# Patient Record
Sex: Female | Born: 1980 | Race: White | Hispanic: No | Marital: Married | Smoking: Never smoker
Health system: Southern US, Community
[De-identification: ages and names within clinical notes are randomized; demographics above are authoritative.]

## PROBLEM LIST (undated history)

## (undated) DIAGNOSIS — Z789 Other specified health status: Secondary | ICD-10-CM

## (undated) DIAGNOSIS — N6019 Diffuse cystic mastopathy of unspecified breast: Secondary | ICD-10-CM

## (undated) HISTORY — DX: Diffuse cystic mastopathy of unspecified breast: N60.19

## (undated) HISTORY — PX: NO PAST SURGERIES: SHX2092

---

## 2004-03-18 ENCOUNTER — Ambulatory Visit: Payer: Self-pay | Admitting: Family Medicine

## 2004-07-15 ENCOUNTER — Ambulatory Visit: Payer: Self-pay | Admitting: Family Medicine

## 2004-07-28 ENCOUNTER — Ambulatory Visit: Payer: Self-pay | Admitting: Family Medicine

## 2005-06-30 ENCOUNTER — Ambulatory Visit: Payer: Self-pay | Admitting: Family Medicine

## 2005-09-14 ENCOUNTER — Other Ambulatory Visit: Admission: RE | Admit: 2005-09-14 | Discharge: 2005-09-14 | Payer: Self-pay | Admitting: Family Medicine

## 2005-09-14 ENCOUNTER — Ambulatory Visit: Payer: Self-pay | Admitting: Family Medicine

## 2005-09-14 LAB — CONVERTED CEMR LAB: Pap Smear: NORMAL

## 2005-09-18 ENCOUNTER — Ambulatory Visit: Payer: Self-pay | Admitting: Family Medicine

## 2006-07-04 IMAGING — US ULTRASOUND LEFT BREAST
1 series · 4 of 4 positions shown · non-contrast
Comparison: none

REASON FOR EXAM: LEFT breast lump, fibrocystic breasts, family hx breast
cancer  Mammo at [DATE]
COMMENTS:

[Series 1: ultrasound left breast · 4 of 4 slices shown]
[im 1/4]
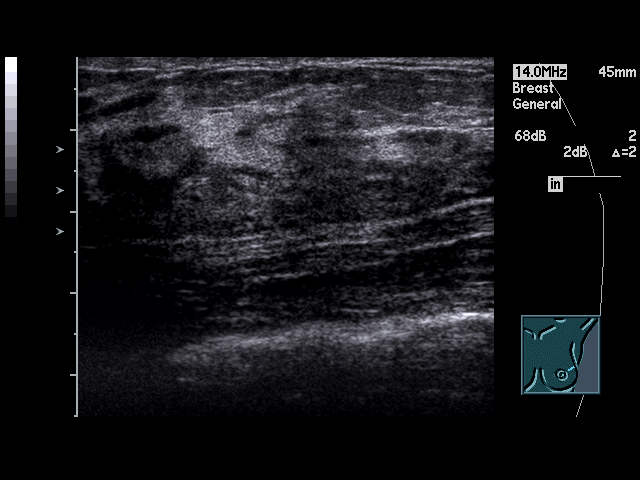
[im 2/4]
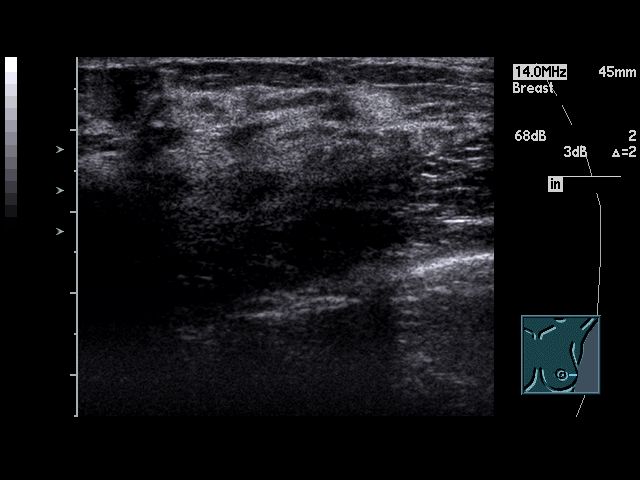
[im 3/4]
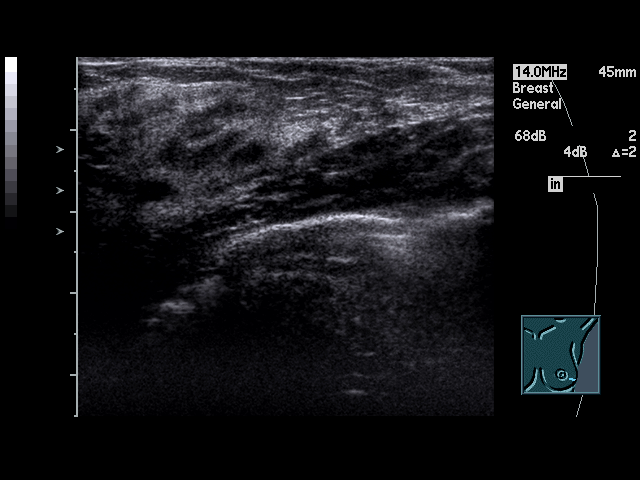
[im 4/4]
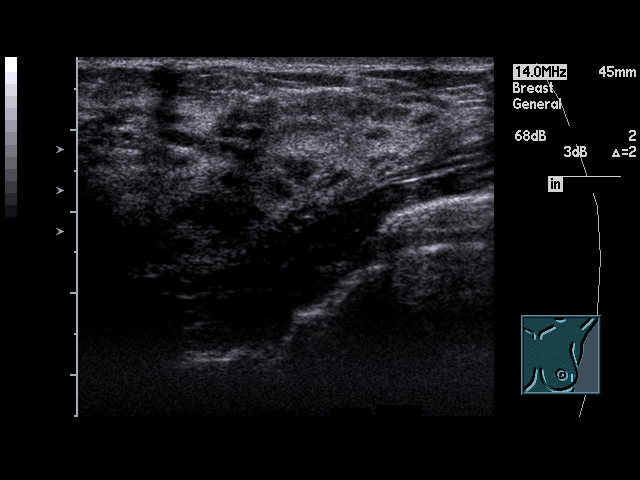

[4 of 4 positions shown; findings below may reference images not displayed]

PROCEDURE:     US  - US BREAST LEFT  - September 18, 2005  [DATE]

RESULT:     The LEFT breast ultrasound was performed in the region of
palpable abnormality.  No cystic or solid abnormalities are identified.  I
performed physical examination in the region of the LEFT breast questionable
abnormality and felt no discrete mass lesion but normal dense parenchyma.
On exam of the contralateral breast, the patient had a similar ridge of
tissue.  Ultrasound was also performed at this region and had the same
appearance.  Given the absence of palpable abnormality by my exam and normal
ultrasound as well as the patient's young age, we discussed that the patient
should just remain in clinical followup and not obtain a mammogram at this
time.  The patient also stated that she has felt this fullness for eight
years and there has been no new abnormality palpable.
IMPRESSION: Normal LEFT breast ultrasound.  Continued close clinical followup exam is
recommended to demonstrate stability.  The patient was also instructed to
perform self-breast exams on a routine basis.  The patient was also notified
as was the patient's physician office that we can perform a mammogram at any
time if a true palpable abnormality is found or if there is further clinical
concern.

## 2007-01-28 ENCOUNTER — Encounter (INDEPENDENT_AMBULATORY_CARE_PROVIDER_SITE_OTHER): Payer: Self-pay | Admitting: *Deleted

## 2007-02-22 ENCOUNTER — Telehealth (INDEPENDENT_AMBULATORY_CARE_PROVIDER_SITE_OTHER): Payer: Self-pay | Admitting: *Deleted

## 2007-05-18 ENCOUNTER — Encounter: Payer: Self-pay | Admitting: Family Medicine

## 2007-05-18 DIAGNOSIS — N6019 Diffuse cystic mastopathy of unspecified breast: Secondary | ICD-10-CM | POA: Insufficient documentation

## 2007-05-18 DIAGNOSIS — L408 Other psoriasis: Secondary | ICD-10-CM | POA: Insufficient documentation

## 2007-07-27 ENCOUNTER — Encounter: Payer: Self-pay | Admitting: Family Medicine

## 2007-07-27 ENCOUNTER — Other Ambulatory Visit: Admission: RE | Admit: 2007-07-27 | Discharge: 2007-07-27 | Payer: Self-pay | Admitting: Family Medicine

## 2007-07-27 ENCOUNTER — Ambulatory Visit: Payer: Self-pay | Admitting: Family Medicine

## 2007-07-28 ENCOUNTER — Telehealth: Payer: Self-pay | Admitting: Family Medicine

## 2007-07-28 LAB — CONVERTED CEMR LAB
Albumin: 3.8 g/dL (ref 3.5–5.2)
BUN: 6 mg/dL (ref 6–23)
Bilirubin, Direct: 0.1 mg/dL (ref 0.0–0.3)
Calcium: 9.2 mg/dL (ref 8.4–10.5)
Cholesterol: 150 mg/dL (ref 0–200)
Eosinophils Absolute: 0.2 10*3/uL (ref 0.0–0.7)
GFR calc Af Amer: 129 mL/min
Glucose, Bld: 86 mg/dL (ref 70–99)
HCT: 36.5 % (ref 36.0–46.0)
Hemoglobin: 12.3 g/dL (ref 12.0–15.0)
MCV: 93.8 fL (ref 78.0–100.0)
Monocytes Absolute: 0.4 10*3/uL (ref 0.1–1.0)
Monocytes Relative: 5 % (ref 3.0–12.0)
Neutro Abs: 5.9 10*3/uL (ref 1.4–7.7)
RDW: 11.8 % (ref 11.5–14.6)
Sodium: 139 meq/L (ref 135–145)
Total CHOL/HDL Ratio: 2.6
Total Protein: 6.7 g/dL (ref 6.0–8.3)
Triglycerides: 77 mg/dL (ref 0–149)

## 2007-08-02 ENCOUNTER — Encounter (INDEPENDENT_AMBULATORY_CARE_PROVIDER_SITE_OTHER): Payer: Self-pay | Admitting: *Deleted

## 2007-08-02 LAB — CONVERTED CEMR LAB: Pap Smear: NORMAL

## 2007-08-08 ENCOUNTER — Encounter (INDEPENDENT_AMBULATORY_CARE_PROVIDER_SITE_OTHER): Payer: Self-pay | Admitting: *Deleted

## 2008-11-09 ENCOUNTER — Other Ambulatory Visit: Admission: RE | Admit: 2008-11-09 | Discharge: 2008-11-09 | Payer: Self-pay | Admitting: Family Medicine

## 2008-11-09 ENCOUNTER — Ambulatory Visit: Payer: Self-pay | Admitting: Family Medicine

## 2008-11-09 ENCOUNTER — Encounter: Payer: Self-pay | Admitting: Family Medicine

## 2008-11-09 LAB — HM PAP SMEAR

## 2008-11-16 ENCOUNTER — Encounter (INDEPENDENT_AMBULATORY_CARE_PROVIDER_SITE_OTHER): Payer: Self-pay | Admitting: *Deleted

## 2009-11-21 ENCOUNTER — Ambulatory Visit: Payer: Self-pay | Admitting: Family Medicine

## 2009-11-21 DIAGNOSIS — M2550 Pain in unspecified joint: Secondary | ICD-10-CM | POA: Insufficient documentation

## 2009-11-22 LAB — CONVERTED CEMR LAB
Eosinophils Relative: 3.5 % (ref 0.0–5.0)
Monocytes Relative: 6.1 % (ref 3.0–12.0)
Neutrophils Relative %: 59.7 % (ref 43.0–77.0)
Platelets: 296 10*3/uL (ref 150.0–400.0)
WBC: 8.1 10*3/uL (ref 4.5–10.5)

## 2010-04-25 ENCOUNTER — Ambulatory Visit
Admission: RE | Admit: 2010-04-25 | Discharge: 2010-04-25 | Payer: Self-pay | Source: Home / Self Care | Attending: Family Medicine | Admitting: Family Medicine

## 2010-05-13 NOTE — Assessment & Plan Note (Signed)
Summary: ? LYMES DISEASE   Vital Signs:  Patient profile:   30 year old female Height:      67 inches Weight:      131.50 pounds BMI:     20.67 Temp:     98.6 degrees F oral Pulse rate:   76 / minute Pulse rhythm:   regular BP sitting:   104 / 70  (left arm) Cuff size:   regular  Vitals Entered By: Lewanda Rife LPN (November 21, 2009 9:05 AM) CC: ?Lymes Disease In May or June had 3 deer ticks, No rash but rt elbow and finger hurts. Sharp pain on and off. Pain level now 0.   History of Present Illness: does not think she has lyme - but family wanted her to get checked out   occ joint pain in her R elbow and index finger -- occ wakes up at night  had 3 deer ticks in may and june one left red spot on stomach for 2 wk (little red spot with no bullseye rash)  no rash at all   has been working in the garden this summer -- and is R handed -- thinks is more of overuse injury  really busy active summer   no fever  is fatigued - due to schedule  no st or n/v  no joint redness and swelling     Allergies: 1)  ! * Td Immunization  Past History:  Past Medical History: Last updated: 07/27/2007 fibrocystic breasts family hx breast ca family hx heart disease  Family History: Last updated: 05/18/2007 Father: MI x 2, CABG x 2, stent, HTN Mother: HTN, breast cancer Siblings: 1 sister  Social History: Last updated: 11/09/2008 nonsmoker alcohol - 2 glasses of wine per week married  works in Scientist, research (physical sciences)  owns small farm with animals   Risk Factors: Smoking Status: never (05/18/2007)  Review of Systems General:  Complains of fatigue; denies chills, fever, loss of appetite, malaise, and weight loss. Eyes:  Denies blurring, discharge, and eye irritation. ENT:  Denies earache, postnasal drainage, sinus pressure, and sore throat. CV:  Denies chest pain or discomfort, palpitations, shortness of breath with exertion, and swelling of feet. Resp:  Denies cough, shortness of  breath, sputum productive, and wheezing. GI:  Denies diarrhea, nausea, and vomiting. MS:  Complains of stiffness; denies joint redness, joint swelling, muscle aches, and cramps. Derm:  Complains of insect bite(s); denies itching, lesion(s), poor wound healing, and rash. Neuro:  Denies headaches, numbness, and tingling. Psych:  mood is ok . Endo:  Denies cold intolerance, excessive thirst, excessive urination, and heat intolerance. Heme:  Denies abnormal bruising and bleeding.  Physical Exam  General:  Well-developed,well-nourished,in no acute distress; alert,appropriate and cooperative throughout examination Head:  normocephalic, atraumatic, and no abnormalities observed.   Eyes:  vision grossly intact, pupils equal, pupils round, and pupils reactive to light.  no conjunctival pallor, injection or icterus  Ears:  R ear normal and L ear normal.   Nose:  no nasal discharge.   Mouth:  pharynx pink and moist.   Neck:  No deformities, masses, or tenderness noted. Lungs:  Normal respiratory effort, chest expands symmetrically. Lungs are clear to auscultation, no crackles or wheezes. Heart:  Normal rate and regular rhythm. S1 and S2 normal without gallop, murmur, click, rub or other extra sounds. Abdomen:  Bowel sounds positive,abdomen soft and non-tender without masses, organomegaly or hernias noted. Msk:  nl rom R elbow with slt lat epicondyle tenderness  R index finger- nl rom and no swelling or tenderness Pulses:  R and L carotid,radial,femoral,dorsalis pedis and posterior tibial pulses are full and equal bilaterally Extremities:  No clubbing, cyanosis, edema, or deformity noted with normal full range of motion of all joints.   Neurologic:  strength normal in all extremities, sensation intact to light touch, gait normal, and DTRs symmetrical and normal.   Skin:  Intact without suspicious lesions or rashes no residual markings from tick biltes in past  Cervical Nodes:  No lymphadenopathy  noted Inguinal Nodes:  No significant adenopathy Psych:  normal affect, talkative and pleasant    Impression & Recommendations:  Problem # 1:  ARTHRALGIA (ICD-719.40) Assessment New in 2 joints without signs of synovitis also fatigue and hx of deer tick bites (no rash ) labs today  suspect joint problems are actually from overuse  Orders: Venipuncture (36644) TLB-CBC Platelet - w/Differential (85025-CBCD) T-Lyme Disease (03474-25956) T- * Misc. Laboratory test [phone])  Problem # 2:  FATIGUE (ICD-780.79) Assessment: New susupect this is actually from long hours at work and in the garden cbc and tick labs today Orders: Venipuncture (43329) TLB-CBC Platelet - w/Differential (85025-CBCD) T-Lyme Disease (51884-16606) T- * Misc. Laboratory test [phone]) Specimen Handling (10932)  Problem # 3:  TICK BITE (ICD-E906.4) Assessment: New gone with no rash or residual findings  see prev assessment Orders: Venipuncture (35573) TLB-CBC Platelet - w/Differential (85025-CBCD) T-Lyme Disease (22025-42706) T- * Misc. Laboratory test [phone]) Specimen Handling (83151)  Patient Instructions: 1)  use ice / cold compresses on finger and elbow - try to rest them  2)  ibuprofen ok as needed otc with food  3)  labs today for tick diseases- will update you  4)  update me if fever/ rash/ worse joint pain  or any other new symptoms   Prior Medications (reviewed today): None Current Allergies (reviewed today): ! * TD IMMUNIZATION

## 2010-05-15 NOTE — Assessment & Plan Note (Signed)
Summary: COLD SYMPTOMS x2 WEEKS AND GETTING WORSE/JRR   Vital Signs:  Patient profile:   30 year old female Weight:      134.50 pounds Temp:     97.9 degrees F oral Pulse rate:   84 / minute Pulse rhythm:   regular BP sitting:   120 / 76  (left arm) Cuff size:   regular  Vitals Entered By: Selena Batten Dance CMA Duncan Dull) (April 25, 2010 8:33 AM) CC: Cold x2 weeks Comments Has taken Nyquil and Mucinex-no help   History of Present Illness: CC: cold x 2 wks.  2wk h/o fever/chills, body aches.  Then started with congestion, chest cold, coughing.  + productive rattly.  hacking cough episodes.  tried mucinex, aspirin, nyquil which seemed to help at night.  Slim chance may be pregnant.  LMP 04/07/2010.  acutally feeling better today.  + lots of congestion and blowing nose at night with clear mucous.  No sinus pressure/head congestion.  No ear pain ,tooth pain, no more fevers/chills, abd pain, n/v/d, rashes, myalgias, arthralgias.    + husband and ppl at work sick last week.    No asthma, alleriges.  did have bad case of asthmatic bronchitis in college.  No smokers at home  Current Medications (verified): 1)  None  Allergies: 1)  ! 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   Eyes:  vision grossly intact, pupils equal, pupils round, and pupils reactive to light.  no conjunctival pallor, injection or icterus  Ears:  R ear normal and L ear normal.   Nose:  no nasal discharge.   Mouth:   pharynx pink and moist.   Neck:  No deformities, masses, or tenderness noted.  no LAD Lungs:  Normal respiratory effort, chest expands symmetrically. Lungs are clear to auscultation, no crackles or wheezes. Heart:  Normal rate and regular rhythm. S1 and S2 normal without gallop, murmur, click, rub or other extra sounds. Pulses:  2+ rad pulses, brisk cap refill   Impression & Recommendations:  Problem # 1:  ACUTE BRONCHITIS (ICD-466.0) zpack to hold on to in case deteriorates over weekend, advised to fill only if not improving as currently.  red flags to return discussed.  given trying for pregnancy, no cough med.  Her updated medication list for this problem includes:    Zithromax Z-pak 250 Mg Tabs (Azithromycin) ..... Use as directed  Complete Medication List: 1)  Zithromax Z-pak 250 Mg Tabs (Azithromycin) .... Use as directed  Patient Instructions: 1)  Sounds like you have a viral upper respiratory infection. 2)  Antibiotics are not needed for this.  Viral infections usually take 7-10 days to resolve.  The cough can last 4 weeks to go away. 3)  guaifenesin (simple mucinex) with plenty of fluid. 4)  cough drops, honey for cough. 5)  A zpack to hold on to in case not improving as expected over weekend. 6)  push fluids, rest over weekend. 7)  Please return if you are not improving as expected, or if you have high fevers (>  101.5) or difficulty swallowing. 8)  Call clinic with questions.  Pleasure to see you today. Prescriptions: ZITHROMAX Z-PAK 250 MG TABS (AZITHROMYCIN) use as directed  #1 x 0   Entered and Authorized by:   Eustaquio Boyden  MD   Signed by:   Eustaquio Boyden  MD on 04/25/2010   Method used:   Print then Give to Patient   RxID:   [id number]    Orders Added: 1)  Est. Patient Level III [14782]    Prior Medications: Current Allergies (reviewed today): ! * TD IMMUNIZATION

## 2010-06-09 ENCOUNTER — Telehealth: Payer: Self-pay | Admitting: Family Medicine

## 2010-06-10 ENCOUNTER — Ambulatory Visit (INDEPENDENT_AMBULATORY_CARE_PROVIDER_SITE_OTHER): Payer: BC Managed Care – PPO | Admitting: Family Medicine

## 2010-06-10 ENCOUNTER — Encounter: Payer: Self-pay | Admitting: Family Medicine

## 2010-06-10 DIAGNOSIS — J069 Acute upper respiratory infection, unspecified: Secondary | ICD-10-CM

## 2010-06-10 LAB — CONVERTED CEMR LAB: Rapid Strep: NEGATIVE

## 2010-06-19 ENCOUNTER — Telehealth: Payer: Self-pay | Admitting: Family Medicine

## 2010-06-19 NOTE — Assessment & Plan Note (Signed)
Summary: EAR PAIN,ST,COUGH,HA,JOINT PAIN/CLE  [employer]   Vital Signs:  Patient profile:   30 year old female Height:      67 inches Weight:      130.50 pounds BMI:     20.51 Temp:     98.2 degrees F oral Pulse rate:   76 / minute Pulse rhythm:   regular Resp:     20 per minute BP sitting:   100 / 68  (left arm) Cuff size:   regular  Vitals Entered By: Lewanda Rife LPN (June 10, 2010 11:49 AM) CC: Left earache, sorethroat,h/a, joint pain primarlily knees, non productive cough.   History of Present Illness: early january had cold and flu symptoms with cough -- saw Dr Reece Agar-- bronchitis dx  got better and then worse - lingered   fri started feeling worse again - filled abx zpack on sat has taken that  sore throat on L side and also ear pain  has had chills and sweats  no fever now -- is getting better  achey too   taken nyquil and ibuprofen- these really help   strep test neg today  L ear pain is the worst    Allergies: 1)  ! 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 Eyes:  Denies blurring and eye irritation. ENT:  Complains of earache, nasal congestion, postnasal drainage, sinus pressure, and sore throat; denies ear discharge. CV:  Denies chest pain or discomfort, palpitations, and shortness of breath with exertion. Resp:  Complains of cough; denies shortness of breath and wheezing. GI:  Denies abdominal pain, change in bowel  habits, nausea, and vomiting. Derm:  Denies itching, lesion(s), poor wound healing, and rash. Neuro:  Denies numbness and tingling.  Physical Exam  General:  Well-developed,well-nourished,in no acute distress; alert,appropriate and cooperative throughout examination Head:  normocephalic, atraumatic, and no abnormalities observed.  no sinus tenderness  Eyes:  vision grossly intact, pupils equal, pupils round, pupils reactive to light, and no injection.   Ears:  R ear normal.  L TM dull with effusion- no erythema or bulging  Nose:  nares are injected and congested bilaterally  Mouth:  pharynx pink and moist, no erythema, and no exudates.   Neck:  No deformities, masses, or tenderness noted. Chest Wall:  No deformities, masses, or tenderness noted. Lungs:  Normal respiratory effort, chest expands symmetrically. Lungs are clear to auscultation, no crackles or wheezes. Heart:  Normal rate and regular rhythm. S1 and S2 normal without gallop, murmur, click, rub or other extra sounds. Msk:  No deformity or scoliosis noted of thoracic or lumbar spine.   Skin:  Intact without suspicious lesions or rashes Cervical Nodes:  No lymphadenopathy noted Psych:  normal affect, talkative and pleasant    Impression & Recommendations:  Problem # 1:  VIRAL URI (ICD-465.9) Assessment New with ear pain / ETD/ nasal congestion and dry cough  fever resolved since the weekend recommend sympt care- see pt instructions   pt advised to  update me if symptoms worsen or do not improve - esp if worse ear pain or fever  did px flonase to help ETD and will keep me updated  Her updated medication list for this problem includes:    Nyquil 60-7.09-09-998 Mg/48ml Liqd (Pseudoeph-doxylamine-dm-apap) ..... Otc as directed.    Ibuprofen 200 Mg Tabs (Ibuprofen) ..... Otc as directed.  Orders: Rapid Strep (04540)  Complete Medication List: 1)  Zithromax Z-pak 250 Mg Tabs (Azithromycin) .... Use as directed 2)  Nyquil  60-7.09-09-998 Mg/73ml Liqd (Pseudoeph-doxylamine-dm-apap) .... Otc as directed. 3)  Ibuprofen 200 Mg Tabs (Ibuprofen) .... Otc as directed. 4)  Flonase 50 Mcg/act Susp (Fluticasone propionate) .... 2 sprays in each nostril once daily  Patient Instructions: 1)  finish the zithromax pack 2)  this stays in your system for a while 3)  continue nyquil and ibuprofen if they help  4)  use flonase nasal spray for at least 2 weeks to help open up sinuses and ears  5)  please call if you think you are getting worse or not starting to improve in the next week Prescriptions: FLONASE 50 MCG/ACT SUSP (FLUTICASONE PROPIONATE) 2 sprays in each nostril once daily  #1 mdi x 1   Entered and Authorized by:   Judith Part MD   Signed by:   Judith Part MD on 06/10/2010   Method used:   Electronically to        [facility]* (retail)       [address]       [place], [place]  [address]       Ph: [phone]       Fax: [phone]   RxID:   [id number]    Orders Added: 1)  Rapid Strep [72536] 2)  Est. Patient Level III [64403]    Current Allergies (reviewed today): ! * TD IMMUNIZATION  Laboratory Results  Date/Time Received: June 10, 2010 11:52 AM  Date/Time Reported: June 10, 2010 11:52 AM   Other Tests  Rapid Strep: negative

## 2010-06-19 NOTE — Progress Notes (Signed)
Summary: pt taking antibiotic  Phone Note Call from Patient   Caller: Patient Call For: Judith Part MD Summary of Call: Pt was seen last month and given z-pack for URI.  She started feeling better so didnt get script filled. Her symptoms started coming back so she got script filled and has been taking abx for 3 days.  She has appt to see you tomorrow and wanted you to know this. Initial call taken by: Lowella Petties CMA, AAMA,  June 09, 2010 11:37 AM  Follow-up for Phone Call        ok -- will see her then - adv her to continue taking the abx since she started it  Follow-up by: Judith Part MD,  June 09, 2010 12:39 PM  Additional Follow-up for Phone Call Additional follow up Details #1::        Left message on machine for patient to call back. Sydell Axon LPN  June 09, 2010 3:37 PM   Patient returned call and was notified.  Additional Follow-up by: Melody Comas,  June 09, 2010 3:41 PM

## 2010-06-24 NOTE — Progress Notes (Signed)
Summary: ears, cough  Phone Note Call from Patient Call back at Home Phone [phone] Call back at Work Phone [phone]   Caller: Patient Call For: Judith Part MD Summary of Call: Patient was seen on 06-10-10 with sinus problems. She says that after finishing her zpak and using the flonase she had started to feel some better, but for the past few days she feels bad all over again, her ears feel stopped up w/ some pain, cough, feels congested, . She is asking if she could get something called in to [facility].  Initial call taken by: Melody Comas,  June 19, 2010 4:44 PM  Follow-up for Phone Call        please notify can try cough syrup called in.  rec continue flonase as that will help with congestion.  use nasal saline irrigation (OTC).   discuss sedation precautions.  Follow-up by: Eustaquio Boyden  MD,  June 19, 2010 4:50 PM  Additional Follow-up for Phone Call Additional follow up Details #1::        Rx called in as directed. Patient notified of instructions. Offered to schedule recheck and she declined stating she will wait and see how she feels tomorrow. Additional Follow-up by: Janee Morn CMA Duncan Dull),  June 19, 2010 5:04 PM    New/Updated Medications: CHERATUSSIN AC 100-10 MG/5ML SYRP (GUAIFENESIN-CODEINE) one teaspoon q6 hours as needed cough, sedation precautions Prescriptions: CHERATUSSIN AC 100-10 MG/5ML SYRP (GUAIFENESIN-CODEINE) one teaspoon q6 hours as needed cough, sedation precautions  #120cc x 0   Entered and Authorized by:   Eustaquio Boyden  MD   Signed by:   Janee Morn CMA (AAMA) on 06/19/2010   Method used:   Telephoned to ...       [facility]* (retail)       [address]       [place], [place]  [address]       Ph: [phone]       Fax: [phone]   RxID:   [id number]

## 2010-11-04 ENCOUNTER — Encounter: Payer: Self-pay | Admitting: Family Medicine

## 2010-11-05 ENCOUNTER — Ambulatory Visit (INDEPENDENT_AMBULATORY_CARE_PROVIDER_SITE_OTHER): Payer: BC Managed Care – PPO | Admitting: Family Medicine

## 2010-11-05 ENCOUNTER — Encounter: Payer: Self-pay | Admitting: Family Medicine

## 2010-11-05 VITALS — BP 124/72 | HR 92 | Temp 97.9°F | Ht 67.0 in | Wt 130.2 lb

## 2010-11-05 DIAGNOSIS — Z3400 Encounter for supervision of normal first pregnancy, unspecified trimester: Secondary | ICD-10-CM | POA: Insufficient documentation

## 2010-11-05 DIAGNOSIS — Z3200 Encounter for pregnancy test, result unknown: Secondary | ICD-10-CM

## 2010-11-05 LAB — POCT URINE PREGNANCY: Preg Test, Ur: POSITIVE

## 2010-11-05 MED ORDER — FOLIC ACID 400 MCG PO TABS: 800.0000 ug | ORAL_TABLET | Freq: Every day | ORAL | Status: DC

## 2010-11-05 NOTE — Assessment & Plan Note (Addendum)
By LMP 6 4/7 weeks  Is tired and mild nausea- but otherwise doing well  Counseled extensively on pregnancy care and options as well as expectations for first trimester  Disc ways to control nausea and need for fluids/ nutrition/sleep >25 min spent with face to face with patient, >50% counseling and/or coordinating care   Ref to [facility] practice with midwife

## 2010-11-05 NOTE — Progress Notes (Signed)
Subjective:    Patient ID: Dominique Russo, female    DOB: Oct 21, 1980, 30 y.o.   MRN: [id number]  HPI LMP June 9th  Usually pretty regular This is her first pregnancy   Did pregnancy test at home was pos  Needs to do one here   Not trying very long  Is taking folic acid 800 mg  and DHA over the counter  Not on a regular multivitamin   Is really nauseated but no vomiting - pretty much all the time  Managed to mt weight  Good balanced diet and is organic  Still has an appetite  No heartburn   Is urinating more frequently but no dysuria   Is not reading any books   Is quite tired and was able to cut hours in work   Wants to give birth out of a hospital - in a birthing center That will probably be in [place]  Would consider hosp birth if she had a midwife  Patient Active Problem List  Diagnoses  . FIBROCYSTIC BREAST DISEASE  . PSORIASIS  . ARTHRALGIA  . Pregnancy, first   Past Medical History  Diagnosis Date  . Fibrocystic breast    No past surgical history on file. History  Substance Use Topics  . Smoking status: Never Smoker   . Smokeless tobacco: Not on file  . Alcohol Use: Yes     2 glasses of wine per week    Family History  Problem Relation Age of Onset  . Hypertension Mother   . Breast cancer Mother   . Heart attack Father     x 2, CBG x 2, stent  . Hypertension Father    Allergies  Allergen Reactions  . Tetanus-Diphtheria Toxoids     REACTION: painful local reaction 4/09   No current outpatient prescriptions on file prior to visit.          Review of Systems Review of Systems  Constitutional: Negative for fever, appetite change, and unexpected weight change. pos for fatigue  Eyes: Negative for pain and visual disturbance.  Respiratory: Negative for cough and shortness of breath.   Cardiovascular: Negative.  for cp or sob or edema  Gastrointestinal: Negative for, diarrhea and constipation. pos for nausea Genitourinary:  Negative for urgency and frequency.  Skin: Negative for pallor. or rash  Neurological: Negative for weakness, light-headedness, numbness and headaches.  Hematological: Negative for adenopathy. Does not bruise/bleed easily.  Psychiatric/Behavioral: Negative for dysphoric mood. The patient is not nervous/anxious.          Objective:   Physical Exam  Constitutional: She appears well-developed and well-nourished. No distress.  HENT:  Head: Normocephalic and atraumatic.  Mouth/Throat: Oropharynx is clear and moist.  Eyes: Conjunctivae and EOM are normal. Pupils are equal, round, and reactive to light.  Neck: Normal range of motion. Neck supple. No JVD present. Carotid bruit is not present. No thyromegaly present.  Cardiovascular: Normal rate, regular rhythm, normal heart sounds and intact distal pulses.   No murmur heard. Pulmonary/Chest: Effort normal and breath sounds normal. No respiratory distress. She has no wheezes.  Abdominal: Soft. Bowel sounds are normal. She exhibits no distension and no mass. There is no tenderness. There is no rebound and no guarding.       No suprapubic tenderness  Or fullness Unable to palp fundus  Musculoskeletal: Normal range of motion. She exhibits no edema and no tenderness.  Lymphadenopathy:    She has no cervical adenopathy.  Neurological: She is  alert. She has normal reflexes.       No tremor  Skin: Skin is warm and dry. No rash noted. No erythema. No pallor.  Psychiatric: She has a normal mood and affect.       Pt seems very mildly anxious           Assessment & Plan:

## 2010-11-05 NOTE — Patient Instructions (Addendum)
Urine pregnancy test today We will do referral for OB at check out  Consider reading "what to expect when you are expecting"  Drink lots of fluids Eat small frequent meals for nausea Get enough rest Continue folic acid

## 2010-11-24 LAB — HIV ANTIBODY (ROUTINE TESTING W REFLEX): HIV: NONREACTIVE

## 2010-11-24 LAB — ABO/RH

## 2010-11-24 LAB — GC/CHLAMYDIA PROBE AMP, GENITAL
Chlamydia: NEGATIVE
Gonorrhea: NEGATIVE

## 2011-01-08 LAB — STREP B DNA PROBE: GBS: POSITIVE

## 2011-04-14 NOTE — L&D Delivery Note (Signed)
Delivery Note  Complete dilation at 2220 Onset of pushing at 0100 consistently and effective FHR second stage baseline 150 - some variables to 100-110  Anesthesia: local for repair only / no analgesia in labor  Delivery of a viable female at 0213 by CNM in ROA position with compound presentation Nuchal Cord none. Cord double clamped after cessation of pulsation, cut by provider.  Cord blood sample collected.    Placenta delivered 0223 intact with 3 VC.  Placenta for disposal. Uterine tone boggy with brisk bleeding  - uterine massage and IV bolus pitocin Intermittent tone despite pitocin and massage -Cytotec per rectum  2nd degree perineal laceration with left sulcus Xylocaine 1% local given 3-0 locked vicryl repair of Left sulcus with hemostasis 3-0 vicryl deep interrupted for muscle approximation in standard fashion 4-0 vicryl for perineal and subcuticular closure Est. Blood Loss (mL): 600  Complications: none  Mom to postpartum.  Baby to nursery-stable.  [name] 07/03/2011, 2:59 AM

## 2011-07-02 ENCOUNTER — Encounter (HOSPITAL_COMMUNITY): Payer: Self-pay | Admitting: *Deleted

## 2011-07-02 ENCOUNTER — Inpatient Hospital Stay (HOSPITAL_COMMUNITY)
Admission: AD | Admit: 2011-07-02 | Discharge: 2011-07-04 | DRG: 373 | Disposition: A | Discharge status: Home / Self Care | Payer: BC Managed Care – PPO | Source: Ambulatory Visit | Attending: Obstetrics | Admitting: Obstetrics

## 2011-07-02 DIAGNOSIS — O328XX0 Maternal care for other malpresentation of fetus, not applicable or unspecified: Secondary | ICD-10-CM | POA: Diagnosis present

## 2011-07-02 DIAGNOSIS — O99892 Other specified diseases and conditions complicating childbirth: Secondary | ICD-10-CM | POA: Diagnosis present

## 2011-07-02 DIAGNOSIS — O480 Post-term pregnancy: Principal | ICD-10-CM | POA: Diagnosis present

## 2011-07-02 DIAGNOSIS — Z22330 Carrier of Group B streptococcus: Secondary | ICD-10-CM

## 2011-07-02 HISTORY — DX: Other specified health status: Z78.9

## 2011-07-02 LAB — CBC
Hemoglobin: 13.1 g/dL (ref 12.0–15.0)
MCH: 33.2 pg (ref 26.0–34.0)
MCV: 95.7 fL (ref 78.0–100.0)
RBC: 3.94 MIL/uL (ref 3.87–5.11)
WBC: 20.2 10*3/uL — ABNORMAL HIGH (ref 4.0–10.5)

## 2011-07-02 MED ORDER — FLEET ENEMA 7-19 GM/118ML RE ENEM: 1.0000 | ENEMA | RECTAL | Status: DC | PRN

## 2011-07-02 MED ORDER — PENICILLIN G POTASSIUM 5000000 UNITS IJ SOLR
2.5000 10*6.[IU] | INTRAVENOUS | Status: DC
  Filled 2011-07-02 (×2): qty 2.5

## 2011-07-02 MED ORDER — CITRIC ACID-SODIUM CITRATE 334-500 MG/5ML PO SOLN: 30.0000 mL | ORAL | Status: DC | PRN

## 2011-07-02 MED ORDER — LACTATED RINGERS IV SOLN: INTRAVENOUS | Status: DC

## 2011-07-02 MED ORDER — IBUPROFEN 600 MG PO TABS
600.0000 mg | ORAL_TABLET | Freq: Four times a day (QID) | ORAL | Status: DC | PRN
  Administered 2011-07-03: 600 mg via ORAL
  Filled 2011-07-02: qty 1

## 2011-07-02 MED ORDER — LACTATED RINGERS IV SOLN
500.0000 mL | INTRAVENOUS | Status: DC | PRN
  Administered 2011-07-03: 300 mL via INTRAVENOUS

## 2011-07-02 MED ORDER — LIDOCAINE HCL (PF) 1 % IJ SOLN
30.0000 mL | INTRAMUSCULAR | Status: DC | PRN
  Administered 2011-07-03: 30 mL via SUBCUTANEOUS
  Filled 2011-07-02: qty 30

## 2011-07-02 MED ORDER — OXYCODONE-ACETAMINOPHEN 5-325 MG PO TABS: 1.0000 | ORAL_TABLET | ORAL | Status: DC | PRN

## 2011-07-02 MED ORDER — CLINDAMYCIN PHOSPHATE 900 MG/50ML IV SOLN
900.0000 mg | Freq: Three times a day (TID) | INTRAVENOUS | Status: DC
  Administered 2011-07-02: 900 mg via INTRAVENOUS
  Filled 2011-07-02 (×3): qty 50

## 2011-07-02 MED ORDER — ONDANSETRON HCL 4 MG/2ML IJ SOLN: 4.0000 mg | Freq: Four times a day (QID) | INTRAMUSCULAR | Status: DC | PRN

## 2011-07-02 MED ORDER — OXYTOCIN BOLUS FROM INFUSION
500.0000 mL | Freq: Once | INTRAVENOUS | Status: DC
  Filled 2011-07-02: qty 500
  Filled 2011-07-02: qty 1000

## 2011-07-02 MED ORDER — PENICILLIN G POTASSIUM 5000000 UNITS IJ SOLR
5.0000 10*6.[IU] | Freq: Once | INTRAVENOUS | Status: DC
  Filled 2011-07-02: qty 5

## 2011-07-02 MED ORDER — OXYTOCIN 20 UNITS IN LACTATED RINGERS INFUSION - SIMPLE
125.0000 mL/h | Freq: Once | INTRAVENOUS | Status: AC
  Administered 2011-07-03: 125 mL/h via INTRAVENOUS

## 2011-07-02 MED ORDER — ACETAMINOPHEN 325 MG PO TABS: 650.0000 mg | ORAL_TABLET | ORAL | Status: DC | PRN

## 2011-07-02 MED ORDER — CLINDAMYCIN PHOSPHATE 300 MG/50ML IV SOLN: 300.0000 mg | Freq: Three times a day (TID) | INTRAVENOUS | Status: DC

## 2011-07-02 NOTE — MAU Note (Signed)
Pt in for labor eval, reports ucs since 1630. Denies any bleeding or lof.  + FM.

## 2011-07-02 NOTE — Progress Notes (Signed)
Patient ID: Dominique Russo, female   DOB: April 12, 1981, 31 y.o.   MRN: [id number]   S: Feeling some pressure - ctx stronger     Tolerating contractions well   O:  VS: Blood pressure 127/85, pulse 82, temperature 99.4 F (37.4 C), temperature source Axillary, resp. rate 20, height 5' 6.5" (1.689 m), weight 77.111 kg (170 lb), last menstrual period 09/20/2010.        FHR : baseline 150 / variability moderate / accels + / decels- early        Toco: contractions every 2-4 minutes / coupling         Cervix : 8-9/ 90% / vtx / +1 / BBOW        Membranes: AROM - clear  A: Active labor     FHR category 1  P: Expectant management     Anticipate SVB     [name] 07/02/2011, 9:39 PM

## 2011-07-02 NOTE — H&P (Signed)
  OB ADMISSION/ HISTORY & PHYSICAL:  Admission Date: 07/02/2011  6:53 PM  Admit Diagnosis: Active labor  Dominique Russo is a 31 y.o. female presenting for onset of labor - ctx irregular since midnight and regular since 2pm today.  Prenatal History: G1P0   EDC : 06/27/2011 Prenatal care at [facility]  Prenatal course complicated by anxiety / GBS bacturia - resistant strain  Prenatal Labs: ABO, Rh: O (08/13 0000) positive Antibody: Negative (08/13 0000) Rubella: Immune (08/13 0000)  RPR: Nonreactive (08/13 0000)  HBsAg: Negative (08/13 0000)  HIV: Non-reactive (08/13 0000)  GBS: Positive (09/27 0000)  1 hr Glucola : NL   Medical / Surgical History :  Past medical history:  Past Medical History  Diagnosis Date  . Fibrocystic breast   . No pertinent past medical history      Past surgical history:  Past Surgical History  Procedure Date  . No past surgeries      Family History:  Family History  Problem Relation Age of Onset  . Hypertension Mother   . Breast cancer Mother   . Heart attack Father     x 2, CBG x 2, stent  . Hypertension Father   . Anesthesia problems Neg Hx      Social History:  reports that she has never smoked. She does not have any smokeless tobacco history on file. She reports that she drinks alcohol. She reports that she does not use illicit drugs.   Allergies: Tetanus-diphtheria toxoids    Current Medications at time of admission:  Prenatal vitamin  Review of Systems:  Physical Exam:  Dilation: 4.5 Effacement (%): 80 Station: -1 Exam by:: K Anderson,RN  VS: 99.4- 82-20-127/85 General: Alert and oriented  / Anxious Heart: RRR Lungs: clear Abdomen: gravid / non-tender Extremities: no edema  FHR: 150 / moderate / + accels / no decels TOCO: every 5 minutes   Assessment: 40 5/7 weeks active labor + GBS (resistant strain - unsuccessful treatment antepartum with PCN /  cephalosporin)  Plan:  Admit Clindamycin prophylaxis for GBS  Expectant management  Dominique Russo 07/02/2011, 8:15 PM

## 2011-07-03 ENCOUNTER — Encounter (HOSPITAL_COMMUNITY): Payer: Self-pay | Admitting: *Deleted

## 2011-07-03 LAB — ABO/RH: ABO/RH(D): O POS

## 2011-07-03 MED ORDER — METHYLERGONOVINE MALEATE 0.2 MG PO TABS
0.2000 mg | ORAL_TABLET | Freq: Four times a day (QID) | ORAL | Status: AC
  Administered 2011-07-03 – 2011-07-04 (×3): 0.2 mg via ORAL
  Filled 2011-07-03 (×4): qty 1

## 2011-07-03 MED ORDER — SIMETHICONE 80 MG PO CHEW: 80.0000 mg | CHEWABLE_TABLET | ORAL | Status: DC | PRN

## 2011-07-03 MED ORDER — DIBUCAINE 1 % RE OINT: 1.0000 "application " | TOPICAL_OINTMENT | RECTAL | Status: DC | PRN

## 2011-07-03 MED ORDER — MISOPROSTOL 200 MCG PO TABS
ORAL_TABLET | ORAL | Status: AC
  Administered 2011-07-03: 800 ug via RECTAL
  Filled 2011-07-03: qty 4

## 2011-07-03 MED ORDER — WITCH HAZEL-GLYCERIN EX PADS: 1.0000 "application " | MEDICATED_PAD | CUTANEOUS | Status: DC | PRN

## 2011-07-03 MED ORDER — PRENATAL MULTIVITAMIN CH
1.0000 | ORAL_TABLET | Freq: Every day | ORAL | Status: DC
  Administered 2011-07-03 – 2011-07-04 (×2): 1 via ORAL
  Filled 2011-07-03 (×3): qty 1

## 2011-07-03 MED ORDER — BENZOCAINE-MENTHOL 20-0.5 % EX AERO
1.0000 "application " | INHALATION_SPRAY | CUTANEOUS | Status: DC | PRN
  Administered 2011-07-03: 1 via TOPICAL

## 2011-07-03 MED ORDER — DIPHENHYDRAMINE HCL 25 MG PO CAPS: 25.0000 mg | ORAL_CAPSULE | Freq: Four times a day (QID) | ORAL | Status: DC | PRN

## 2011-07-03 MED ORDER — IBUPROFEN 600 MG PO TABS
600.0000 mg | ORAL_TABLET | Freq: Four times a day (QID) | ORAL | Status: DC
  Administered 2011-07-03 – 2011-07-04 (×5): 600 mg via ORAL
  Filled 2011-07-03 (×5): qty 1

## 2011-07-03 MED ORDER — LANOLIN HYDROUS EX OINT: TOPICAL_OINTMENT | CUTANEOUS | Status: DC | PRN

## 2011-07-03 MED ORDER — SENNOSIDES-DOCUSATE SODIUM 8.6-50 MG PO TABS
2.0000 | ORAL_TABLET | Freq: Every day | ORAL | Status: DC
  Administered 2011-07-03: 2 via ORAL

## 2011-07-03 MED ORDER — BENZOCAINE-MENTHOL 20-0.5 % EX AERO
INHALATION_SPRAY | CUTANEOUS | Status: AC
  Administered 2011-07-03: 1 via TOPICAL
  Filled 2011-07-03: qty 56

## 2011-07-03 MED ORDER — OXYCODONE-ACETAMINOPHEN 5-325 MG PO TABS
1.0000 | ORAL_TABLET | ORAL | Status: DC | PRN
  Administered 2011-07-03 (×2): 1 via ORAL
  Filled 2011-07-03 (×2): qty 1

## 2011-07-03 NOTE — Progress Notes (Signed)
2220 - Complete dilation with VTX +2 no urge to push / moaning and grunting             Up to bathroom for BM / pushing intermittently / grunting / crying/ screaming in bathroom  2345 back to bed - intermittent grunting / crying / acute anxiety          Provider talking to relax patient / relaxation imaging - maternal position changes           Recheck fetal station vtx at +2 / attempting coached efforts for pushing in different positions without success          Acute anxiety - feels "panicked" - "cant do it"  Offered epidural - declined  Providing continuous labor support / emotional security with spouse at bedside Continued position changes trying to find position to promote consistent / effective pushing  0030- 0100 -finally  effective pushing established in lithotomy with coached pushing - vtx +3 / patient calm and focused    VS: Blood pressure 133/83, pulse 95, temperature 98.3 F (36.8 C), temperature source Axillary, resp. rate 20, height 5' 6.5" (1.689 m), weight 77.111 kg (170 lb), last menstrual period 09/20/2010.   FHR :   doppler FHR : baseline 150s intermittently    EFM intermittently - baseline 150 /  variability moderate / accels +    + variable decels audible with pushing ~ 0115 / EFM reapplied with some variables to nadir 100-110    Toco: contractions every 2-4 minutes / mild - moderate     A: protracted second stage - active labor due to acute anxiety     FHR category 2  P: Active second stage for 1 hour with some fetal descent      Ineffective CTX intensity - patient declines pitocin augmentation      Mainline IV bolus for uterine hydration      Continue pushing x 1 hour - if no further progress - recommend epidural / pitocin prior to C/S if FHR reassuring     Rhylen Shaheen 07/03/2011, 1:41 AM

## 2011-07-03 NOTE — Progress Notes (Signed)
Pt up to wheelchair and transferred to motherbaby

## 2011-07-04 LAB — CBC
HCT: 27.6 % — ABNORMAL LOW (ref 36.0–46.0)
Hemoglobin: 9.4 g/dL — ABNORMAL LOW (ref 12.0–15.0)
MCH: 33 pg (ref 26.0–34.0)
MCHC: 34.1 g/dL (ref 30.0–36.0)
MCV: 96.8 fL (ref 78.0–100.0)
Platelets: 230 10*3/uL (ref 150–400)
RBC: 2.85 MIL/uL — ABNORMAL LOW (ref 3.87–5.11)
RDW: 13.3 % (ref 11.5–15.5)
WBC: 18.2 10*3/uL — ABNORMAL HIGH (ref 4.0–10.5)

## 2011-07-04 MED ORDER — POLYSACCHARIDE IRON COMPLEX 150 MG PO CAPS: 150.0000 mg | ORAL_CAPSULE | Freq: Two times a day (BID) | ORAL | Status: AC

## 2011-07-04 MED ORDER — OXYCODONE-ACETAMINOPHEN 5-325 MG PO TABS: 1.0000 | ORAL_TABLET | ORAL | Status: AC | PRN

## 2011-07-04 MED ORDER — IBUPROFEN 600 MG PO TABS: 600.0000 mg | ORAL_TABLET | Freq: Four times a day (QID) | ORAL | Status: AC

## 2011-07-04 NOTE — Progress Notes (Signed)
Patient ID: Dominique Russo, female   DOB: 1980/05/15, 31 y.o.   MRN: [id number]  PPD 1 SVD  S:  Reports feeling well - wants early discharge             Tolerating po/ No nausea or vomiting             Bleeding is light             Pain controlled with motrin and percocet             Up ad lib / ambulatory  Newborn breast-feeding     O:  A & O x 3 NAD             VS: Blood pressure 120/76, pulse 85, temperature 97.5 F (36.4 C), temperature source Oral, resp. rate 18, height 5\' 7"  (1.702 m), weight 77.111 kg (170 lb), last menstrual period 09/20/2010, SpO2 99.00%, unknown if currently breastfeeding.  LABS: WBC/Hgb/Hct/Plts:  18.2/9.4/27.6/230 (03/23 0727)   Lungs: Clear and unlabored  Heart: regular rate and rhythm / no mumurs  Abdomen: soft, non-tender, non-distended              Fundus: firm, non-tender, U-2  Perineum: mild edema  Lochia: light  Extremities: no edema, no calf pain or tenderness    A: PPD # 1   Doing well - stable status  P:  Routine post partum orders  Stable for early discharge  [name] 07/04/2011, 12:23 PM

## 2011-07-04 NOTE — Progress Notes (Signed)

## 2011-07-04 NOTE — Discharge Summary (Signed)
Obstetric Discharge Summary Reason for Admission: onset of labor Prenatal Procedures: NST and ultrasound for postdates Intrapartum Procedures: spontaneous vaginal delivery and GBS prophylaxis Postpartum Procedures: none Complications-Operative and Postpartum: 2nd degree perineal laceration Hemoglobin  Date Value Range Status  07/04/2011 9.4* 12.0-15.0 (g/dL) Final     DELTA CHECK NOTED     REPEATED TO VERIFY     HCT  Date Value Range Status  07/04/2011 27.6* 36.0-46.0 (%) Final    Physical Exam:  General: alert, cooperative and no distress Lochia: appropriate Uterine Fundus: firm Incision: healing well DVT Evaluation: No evidence of DVT seen on physical exam.  Discharge Diagnoses: Term Pregnancy-delivered and mild ABL anemia  Discharge Information: Date: 07/04/2011 Activity: pelvic rest Diet: routine Medications: PNV, Ibuprofen and Percocet and iron Condition: stable Instructions: refer to practice specific booklet Discharge to: home Follow-up Information    Follow up with [name], CNM. Schedule an appointment as soon as possible for a visit in 6 weeks.   Contact information:   [address] [place] [address] [phone]          Newborn Data: Live born female  Birth Weight: 8 lb 0.6 oz (3645 g) APGAR: 9, 9  Home with mother.  [name] 07/04/2011, 12:26 PM

## 2013-09-05 ENCOUNTER — Telehealth: Payer: Self-pay | Admitting: Family Medicine

## 2013-09-05 NOTE — Telephone Encounter (Signed)
Left voicemail requesting pt to call office 

## 2013-09-05 NOTE — Telephone Encounter (Signed)
Pt states she is pregnant and her new insurance requires a referral. She would like a referral placed for [facility] on [address] in [place] [phone], pt prefers Midwife, [name]. Can you place the referral even though patient hasn't been seen since July, 2013? Thank you.

## 2013-09-05 NOTE — Telephone Encounter (Signed)
Pt just wanted to come in and est. pregnancy to be on the safe side, appt scheduled for 09/08/13

## 2013-09-05 NOTE — Telephone Encounter (Signed)
I am fine with that - if the practice allows a referral without a documented pregnancy test in a doctor's office -have her call them and check on that and then send this message back to me with her LMP Thanks

## 2013-09-08 ENCOUNTER — Ambulatory Visit: Payer: BC Managed Care – PPO | Admitting: Family Medicine

## 2013-09-12 ENCOUNTER — Ambulatory Visit (INDEPENDENT_AMBULATORY_CARE_PROVIDER_SITE_OTHER): Payer: 59 | Admitting: Family Medicine

## 2013-09-12 ENCOUNTER — Encounter: Payer: Self-pay | Admitting: Family Medicine

## 2013-09-12 VITALS — BP 114/62 | HR 90 | Temp 98.4°F | Ht 67.0 in | Wt 135.5 lb

## 2013-09-12 DIAGNOSIS — Z3490 Encounter for supervision of normal pregnancy, unspecified, unspecified trimester: Secondary | ICD-10-CM

## 2013-09-12 DIAGNOSIS — Z331 Pregnant state, incidental: Secondary | ICD-10-CM

## 2013-09-12 DIAGNOSIS — Z3200 Encounter for pregnancy test, result unknown: Secondary | ICD-10-CM

## 2013-09-12 LAB — POCT URINE PREGNANCY: PREG TEST UR: POSITIVE

## 2013-09-12 NOTE — Progress Notes (Signed)
Subjective:    Patient ID: Dominique MastersElizabeth A Russo, female    DOB: 02/15/1981, 33 y.o.   MRN: [id number]  HPI Here for 2nd pregnancy   Home test neg  Here upreg neg  07/08/13 LMP- normal and she does not tend to skip them  EDC would be Jan 3 9 2/7 weeks today by dates   Is feeling pretty rotten in general  Nauseated - worse in evenings , is eating small amt frequently -does not help much No vomiting  Wt is fine with bmi of 21  Very tired   No complications with first pregnancy  nsvd -no problems   She is on PNV   In April she thought she had a cold/ told it was allergies  Took tylenol at the time  Got sick again in May - took tylenol again Worried about that  No alcohol or smoking   Patient Active Problem List   Diagnosis Date Noted  . Pregnancy 09/12/2013  . Postpartum care following vaginal delivery (3/22) 07/04/2011  . Pregnancy, first 11/05/2010  . ARTHRALGIA 11/21/2009  . FIBROCYSTIC BREAST DISEASE 05/18/2007  . PSORIASIS 05/18/2007   Past Medical History  Diagnosis Date  . Fibrocystic breast   . No pertinent past medical history    Past Surgical History  Procedure Laterality Date  . No past surgeries     History  Substance Use Topics  . Smoking status: Never Smoker   . Smokeless tobacco: Not on file  . Alcohol Use: No   Family History  Problem Relation Age of Onset  . Hypertension Mother   . Breast cancer Mother   . Heart attack Father     x 2, CBG x 2, stent  . Hypertension Father   . Anesthesia problems Neg Hx    Allergies  Allergen Reactions  . Tetanus-Diphtheria Toxoids Td     REACTION: painful local reaction 4/09   Current Outpatient Prescriptions on File Prior to Visit  Medication Sig Dispense Refill  . Prenatal Vit-Fe Fumarate-FA (PRENATAL MULTIVITAMIN) TABS Take 1 tablet by mouth daily.       No current facility-administered medications on file prior to visit.    Review of Systems Review of Systems  Constitutional: Negative for  fever, appetite change,  and unexpected weight change.  Eyes: Negative for pain and visual disturbance.  Respiratory: Negative for cough and shortness of breath.   Cardiovascular: Negative for cp or palpitations    Gastrointestinal: Negative for, diarrhea and constipation. pos for nausea w/o vomiting Genitourinary: Negative for urgency and frequency.  Skin: Negative for pallor or rash   Neurological: Negative for weakness, light-headedness, numbness and headaches.  Hematological: Negative for adenopathy. Does not bruise/bleed easily.  Psychiatric/Behavioral: Negative for dysphoric mood. The patient is not nervous/anxious.         Objective:   Physical Exam  Constitutional: She appears well-developed and well-nourished. No distress.  HENT:  Head: Normocephalic and atraumatic.  Eyes: Conjunctivae and EOM are normal. Pupils are equal, round, and reactive to light. No scleral icterus.  Neck: Normal range of motion. Neck supple.  Cardiovascular: Normal rate and regular rhythm.   Pulmonary/Chest: Effort normal and breath sounds normal. No respiratory distress. She has no wheezes. She has no rales.  Abdominal: Soft. Bowel sounds are normal. She exhibits no distension and no mass. There is no tenderness. There is no rebound and no guarding.  No suprapubic fullness or tenderness  Musculoskeletal: She exhibits no edema.  Lymphadenopathy:    She  has no cervical adenopathy.  Neurological: She is alert. She has normal reflexes.  Skin: Skin is warm and dry. No rash noted. No pallor.  Psychiatric: She has a normal mood and affect.          Assessment & Plan:   Problem List Items Addressed This Visit     Other   Pregnancy     2nd pregnancy approx 9 2/7 weeks by dates  Symptoms of fatigue and nausea/ no vomiting and no bleeding  Will ref to gyn  Rev need to continue PNV and avoid meds otc/ caffeine and alcohol  Nl first pregnancy is reassuring     Relevant Orders      Ambulatory  referral to Obstetrics / Gynecology    Other Visit Diagnoses   Possible pregnancy    -  Primary    Relevant Orders       POCT urine pregnancy (Completed)

## 2013-09-12 NOTE — Patient Instructions (Signed)
Stop up front for referral to OBGYN Take care of yourself - stay hydrated Take prenatal vitamins daily

## 2013-09-12 NOTE — Progress Notes (Signed)
Pre visit review using our clinic review tool, if applicable. No additional management support is needed unless otherwise documented below in the visit note. 

## 2013-09-14 NOTE — Assessment & Plan Note (Signed)
2nd pregnancy approx 9 2/7 weeks by dates  Symptoms of fatigue and nausea/ no vomiting and no bleeding  Will ref to gyn  Rev need to continue PNV and avoid meds otc/ caffeine and alcohol  Nl first pregnancy is reassuring

## 2014-02-12 ENCOUNTER — Encounter: Payer: Self-pay | Admitting: Family Medicine

## 2014-03-15 ENCOUNTER — Inpatient Hospital Stay (HOSPITAL_COMMUNITY): Admission: AD | Admit: 2014-03-15 | Payer: 59 | Source: Ambulatory Visit | Admitting: Obstetrics and Gynecology

## 2014-03-19 LAB — OB RESULTS CONSOLE GBS: GBS: NEGATIVE

## 2014-04-13 NOTE — L&D Delivery Note (Signed)
Delivery Note  First Stage: Labor onset: 1100 Augmentation : none Analgesia /Anesthesia intrapartum: epidural @ 1550 (complete and pushing x 1 hour) AROM at 1055  Second Stage: Complete dilation at 1406 Onset of pushing at 1430 FHR second stage category 1-2 then 3 with crowning (bradycardia 80-90 x 6 minutes prior to delivery with crowning) Delivery of a viable female at [id number] by CNM in LOA position double nuchal cord - loose reduced over head after delivery of anterior shoulder Cord double clamped after cessation of pulsation, cut by FOB Cord blood sample collected   Third Stage:  Large gush of blood with release of placenta Placenta delivered [facility] intact with 3 VC @ 1914 Placenta disposition: disposal Uterine tone firm / bleeding moderate IV pitocin bolus and cytotec 800 per rectum for bleeding prophylaxis  2nd degree asymetric laceration identified with deep right labial extension Anesthesia for repair: epidural and 1% xylocaine local Repair 3-0 vicryl deep interrupted muscle of perineum with two layers to re-approximate anatomy Right labial repair with 4-0 running stitch then resecured to vaginal wall 3-0 vicryl to re-approximate perineum to vaginal floor then 4-0 subcuticular skin closure extended into vagina to re-appoximate vaginal mucosa over deep muscle repair Est. Blood Loss (mL): 450  Complications: none  Mom to postpartum.  Baby to Couplet care / Skin to Skin.  Newborn: Birth Weight: 10-1 Apgar Scores: 8-9 Feeding planned: breast  Dominique Russo, Dominique Russo CNM, MSN, FACNM 04/24/2014, 8:16 PM

## 2014-04-24 ENCOUNTER — Inpatient Hospital Stay (HOSPITAL_COMMUNITY)
Admission: RE | Admit: 2014-04-24 | Discharge: 2014-04-26 | DRG: 775 | Disposition: A | Discharge status: Home / Self Care | Payer: BLUE CROSS/BLUE SHIELD | Source: Ambulatory Visit | Attending: Obstetrics and Gynecology | Admitting: Obstetrics and Gynecology

## 2014-04-24 ENCOUNTER — Encounter (HOSPITAL_COMMUNITY): Payer: Self-pay

## 2014-04-24 ENCOUNTER — Inpatient Hospital Stay (HOSPITAL_COMMUNITY): Payer: BLUE CROSS/BLUE SHIELD | Admitting: Anesthesiology

## 2014-04-24 DIAGNOSIS — Z3A41 41 weeks gestation of pregnancy: Secondary | ICD-10-CM | POA: Diagnosis present

## 2014-04-24 DIAGNOSIS — Z8249 Family history of ischemic heart disease and other diseases of the circulatory system: Secondary | ICD-10-CM

## 2014-04-24 DIAGNOSIS — O133 Gestational [pregnancy-induced] hypertension without significant proteinuria, third trimester: Secondary | ICD-10-CM | POA: Diagnosis present

## 2014-04-24 DIAGNOSIS — O403XX0 Polyhydramnios, third trimester, not applicable or unspecified: Secondary | ICD-10-CM | POA: Diagnosis present

## 2014-04-24 DIAGNOSIS — IMO0002 Reserved for concepts with insufficient information to code with codable children: Secondary | ICD-10-CM | POA: Diagnosis present

## 2014-04-24 DIAGNOSIS — O480 Post-term pregnancy: Secondary | ICD-10-CM | POA: Diagnosis present

## 2014-04-24 DIAGNOSIS — O3663X0 Maternal care for excessive fetal growth, third trimester, not applicable or unspecified: Secondary | ICD-10-CM | POA: Diagnosis present

## 2014-04-24 LAB — COMPREHENSIVE METABOLIC PANEL
ALT: 22 U/L (ref 0–35)
AST: 26 U/L (ref 0–37)
Albumin: 3 g/dL — ABNORMAL LOW (ref 3.5–5.2)
Alkaline Phosphatase: 166 U/L — ABNORMAL HIGH (ref 39–117)
Anion gap: 8 (ref 5–15)
BUN: 10 mg/dL (ref 6–23)
CO2: 21 mmol/L (ref 19–32)
Calcium: 8.8 mg/dL (ref 8.4–10.5)
Chloride: 109 mEq/L (ref 96–112)
Creatinine, Ser: 0.59 mg/dL (ref 0.50–1.10)
GFR calc Af Amer: >90 mL/min (ref >90)
GFR calc non Af Amer: >90 mL/min (ref >90)
Glucose, Bld: 80 mg/dL (ref 70–99)
Potassium: 3.8 mmol/L (ref 3.5–5.1)
Sodium: 138 mmol/L (ref 135–145)
Total Bilirubin: 0.5 mg/dL (ref 0.3–1.2)
Total Protein: 6.1 g/dL (ref 6.0–8.3)

## 2014-04-24 LAB — TYPE AND SCREEN
ABO/RH(D): O POS
Antibody Screen: NEGATIVE

## 2014-04-24 LAB — CBC
HCT: 38.1 % (ref 36.0–46.0)
Hemoglobin: 13.1 g/dL (ref 12.0–15.0)
MCH: 33.8 pg (ref 26.0–34.0)
MCHC: 34.4 g/dL (ref 30.0–36.0)
MCV: 98.2 fL (ref 78.0–100.0)
Platelets: 216 10*3/uL (ref 150–400)
RBC: 3.88 MIL/uL (ref 3.87–5.11)
RDW: 13.1 % (ref 11.5–15.5)
WBC: 12.4 10*3/uL — ABNORMAL HIGH (ref 4.0–10.5)

## 2014-04-24 LAB — OB RESULTS CONSOLE RPR: RPR: NONREACTIVE

## 2014-04-24 LAB — OB RESULTS CONSOLE GC/CHLAMYDIA
CHLAMYDIA, DNA PROBE: NEGATIVE
Gonorrhea: NEGATIVE

## 2014-04-24 LAB — OB RESULTS CONSOLE HIV ANTIBODY (ROUTINE TESTING): HIV: NONREACTIVE

## 2014-04-24 LAB — URIC ACID: Uric Acid, Serum: 4.2 mg/dL (ref 2.4–7.0)

## 2014-04-24 MED ORDER — IBUPROFEN 600 MG PO TABS
600.0000 mg | ORAL_TABLET | Freq: Four times a day (QID) | ORAL | Status: DC
  Administered 2014-04-24 – 2014-04-26 (×6): 600 mg via ORAL
  Filled 2014-04-24 (×6): qty 1

## 2014-04-24 MED ORDER — DIPHENHYDRAMINE HCL 25 MG PO CAPS: 25.0000 mg | ORAL_CAPSULE | Freq: Four times a day (QID) | ORAL | Status: DC | PRN

## 2014-04-24 MED ORDER — WITCH HAZEL-GLYCERIN EX PADS: 1.0000 "application " | MEDICATED_PAD | CUTANEOUS | Status: DC | PRN

## 2014-04-24 MED ORDER — ONDANSETRON HCL 4 MG PO TABS: 4.0000 mg | ORAL_TABLET | ORAL | Status: DC | PRN

## 2014-04-24 MED ORDER — ONDANSETRON HCL 4 MG/2ML IJ SOLN
4.0000 mg | INTRAMUSCULAR | Status: DC | PRN
Start: 2014-04-24 — End: 2014-04-26

## 2014-04-24 MED ORDER — SENNOSIDES-DOCUSATE SODIUM 8.6-50 MG PO TABS
2.0000 | ORAL_TABLET | ORAL | Status: DC
  Administered 2014-04-24: 2 via ORAL
  Filled 2014-04-24: qty 2

## 2014-04-24 MED ORDER — SODIUM CHLORIDE 0.9 % IV SOLN: 250.0000 mL | INTRAVENOUS | Status: DC | PRN

## 2014-04-24 MED ORDER — FENTANYL 2.5 MCG/ML BUPIVACAINE 1/10 % EPIDURAL INFUSION (WH - ANES)
INTRAMUSCULAR | Status: DC | PRN
  Administered 2014-04-24: 14 mL/h via EPIDURAL

## 2014-04-24 MED ORDER — SODIUM CHLORIDE 0.9 % IJ SOLN: 3.0000 mL | INTRAMUSCULAR | Status: DC | PRN

## 2014-04-24 MED ORDER — LIDOCAINE HCL (PF) 1 % IJ SOLN
INTRAMUSCULAR | Status: DC | PRN
  Administered 2014-04-24 (×2): 4 mL

## 2014-04-24 MED ORDER — OXYCODONE-ACETAMINOPHEN 5-325 MG PO TABS: 2.0000 | ORAL_TABLET | ORAL | Status: DC | PRN

## 2014-04-24 MED ORDER — OXYTOCIN 40 UNITS IN LACTATED RINGERS INFUSION - SIMPLE MED
62.5000 mL/h | INTRAVENOUS | Status: DC
Start: 2014-04-24 — End: 2014-04-24
  Filled 2014-04-24: qty 1000

## 2014-04-24 MED ORDER — LACTATED RINGERS IV SOLN: 500.0000 mL | INTRAVENOUS | Status: DC | PRN

## 2014-04-24 MED ORDER — PHENYLEPHRINE 40 MCG/ML (10ML) SYRINGE FOR IV PUSH (FOR BLOOD PRESSURE SUPPORT)
PREFILLED_SYRINGE | INTRAVENOUS | Status: AC
  Filled 2014-04-24: qty 20

## 2014-04-24 MED ORDER — OXYCODONE-ACETAMINOPHEN 5-325 MG PO TABS: 1.0000 | ORAL_TABLET | ORAL | Status: DC | PRN

## 2014-04-24 MED ORDER — LIDOCAINE HCL (PF) 1 % IJ SOLN
30.0000 mL | INTRAMUSCULAR | Status: DC | PRN
  Administered 2014-04-24: 30 mL via SUBCUTANEOUS
  Filled 2014-04-24: qty 30

## 2014-04-24 MED ORDER — OXYTOCIN 10 UNIT/ML IJ SOLN
INTRAMUSCULAR | Status: DC
Start: 2014-04-24 — End: 2014-04-24
  Filled 2014-04-24: qty 1

## 2014-04-24 MED ORDER — OXYTOCIN BOLUS FROM INFUSION
500.0000 mL | INTRAVENOUS | Status: DC
  Administered 2014-04-24: 500 mL via INTRAVENOUS

## 2014-04-24 MED ORDER — CITRIC ACID-SODIUM CITRATE 334-500 MG/5ML PO SOLN
30.0000 mL | ORAL | Status: DC | PRN
  Filled 2014-04-24: qty 15

## 2014-04-24 MED ORDER — EPHEDRINE 5 MG/ML INJ: 10.0000 mg | INTRAVENOUS | Status: DC | PRN

## 2014-04-24 MED ORDER — BENZOCAINE-MENTHOL 20-0.5 % EX AERO
1.0000 "application " | INHALATION_SPRAY | CUTANEOUS | Status: DC | PRN
  Filled 2014-04-24 (×2): qty 56

## 2014-04-24 MED ORDER — SIMETHICONE 80 MG PO CHEW
80.0000 mg | CHEWABLE_TABLET | ORAL | Status: DC | PRN
Start: 2014-04-24 — End: 2014-04-26

## 2014-04-24 MED ORDER — FENTANYL 2.5 MCG/ML BUPIVACAINE 1/10 % EPIDURAL INFUSION (WH - ANES)
INTRAMUSCULAR | Status: DC
Start: 2014-04-24 — End: 2014-04-24
  Filled 2014-04-24: qty 125

## 2014-04-24 MED ORDER — LANOLIN HYDROUS EX OINT: TOPICAL_OINTMENT | CUTANEOUS | Status: DC | PRN

## 2014-04-24 MED ORDER — PHENYLEPHRINE 40 MCG/ML (10ML) SYRINGE FOR IV PUSH (FOR BLOOD PRESSURE SUPPORT): 80.0000 ug | PREFILLED_SYRINGE | INTRAVENOUS | Status: DC | PRN

## 2014-04-24 MED ORDER — LACTATED RINGERS IV SOLN
500.0000 mL | Freq: Once | INTRAVENOUS | Status: AC
  Administered 2014-04-24: 500 mL via INTRAVENOUS

## 2014-04-24 MED ORDER — SODIUM CHLORIDE 0.9 % IJ SOLN: 3.0000 mL | Freq: Two times a day (BID) | INTRAMUSCULAR | Status: DC

## 2014-04-24 MED ORDER — LIDOCAINE-EPINEPHRINE (PF) 2 %-1:200000 IJ SOLN
INTRAMUSCULAR | Status: DC | PRN
  Administered 2014-04-24: 4 mL via EPIDURAL
  Administered 2014-04-24: 5 mL via EPIDURAL

## 2014-04-24 MED ORDER — EPHEDRINE 5 MG/ML INJ
10.0000 mg | INTRAVENOUS | Status: DC | PRN
Start: 2014-04-24 — End: 2014-04-24

## 2014-04-24 MED ORDER — ACETAMINOPHEN 325 MG PO TABS: 650.0000 mg | ORAL_TABLET | ORAL | Status: DC | PRN

## 2014-04-24 MED ORDER — DIPHENHYDRAMINE HCL 50 MG/ML IJ SOLN: 12.5000 mg | INTRAMUSCULAR | Status: DC | PRN

## 2014-04-24 MED ORDER — DIBUCAINE 1 % RE OINT
1.0000 "application " | TOPICAL_OINTMENT | RECTAL | Status: DC | PRN
  Filled 2014-04-24: qty 28

## 2014-04-24 MED ORDER — FENTANYL 2.5 MCG/ML BUPIVACAINE 1/10 % EPIDURAL INFUSION (WH - ANES)
14.0000 mL/h | INTRAMUSCULAR | Status: DC | PRN
  Administered 2014-04-24: 14 mL/h via EPIDURAL

## 2014-04-24 MED ORDER — MISOPROSTOL 200 MCG PO TABS
800.0000 ug | ORAL_TABLET | Freq: Once | ORAL | Status: AC
  Administered 2014-04-24: 800 ug via RECTAL
  Filled 2014-04-24: qty 4

## 2014-04-24 NOTE — Anesthesia Preprocedure Evaluation (Signed)
Anesthesia Evaluation  Patient identified by MRN, date of birth, ID band Patient awake    Reviewed: Allergy & Precautions, H&P , Patient's Chart, lab work & pertinent test results  Airway Mallampati: II  TM Distance: >3 FB Neck ROM: full    Dental no notable dental hx. (+) Teeth Intact   Pulmonary neg pulmonary ROS,  breath sounds clear to auscultation  Pulmonary exam normal       Cardiovascular negative cardio ROS  Rhythm:regular Rate:Normal     Neuro/Psych negative neurological ROS  negative psych ROS   GI/Hepatic negative GI ROS, Neg liver ROS,   Endo/Other  negative endocrine ROS  Renal/GU negative Renal ROS  negative genitourinary   Musculoskeletal   Abdominal Normal abdominal exam  (+)   Peds  Hematology negative hematology ROS (+)   Anesthesia Other Findings   Reproductive/Obstetrics (+) Pregnancy                             Anesthesia Physical Anesthesia Plan  ASA: II  Anesthesia Plan: Epidural   Post-op Pain Management:    Induction:   Airway Management Planned:   Additional Equipment:   Intra-op Plan:   Post-operative Plan:   Informed Consent: I have reviewed the patients History and Physical, chart, labs and discussed the procedure including the risks, benefits and alternatives for the proposed anesthesia with the patient or authorized representative who has indicated his/her understanding and acceptance.     Plan Discussed with: Anesthesiologist  Anesthesia Plan Comments:         Anesthesia Quick Evaluation

## 2014-04-24 NOTE — Anesthesia Procedure Notes (Signed)
Epidural Patient location during procedure: OB Start time: 04/24/2014 3:52 PM  Staffing Anesthesiologist: Jolinda Pinkstaff A.  Preanesthetic Checklist Completed: patient identified, site marked, surgical consent, pre-op evaluation, timeout performed, IV checked, risks and benefits discussed and monitors and equipment checked  Epidural Patient position: sitting Prep: site prepped and draped and DuraPrep Patient monitoring: continuous pulse ox and blood pressure Approach: midline Location: L3-L4 Injection technique: LOR air  Needle:  Needle type: Tuohy  Needle gauge: 17 G Needle length: 9 cm and 9 Needle insertion depth: 5 cm cm Catheter type: closed end flexible Catheter size: 19 Gauge Catheter at skin depth: 10 cm Test dose: negative and Other  Assessment Events: blood not aspirated, injection not painful, no injection resistance, negative IV test and no paresthesia  Additional Notes Patient identified. Risks and benefits discussed including failed block, incomplete  Pain control, post dural puncture headache, nerve damage, paralysis, blood pressure Changes, nausea, vomiting, reactions to medications-both toxic and allergic and post Partum back pain. All questions were answered. Patient expressed understanding and wished to proceed. Sterile technique was used throughout procedure. Epidural site was Dressed with sterile barrier dressing. No paresthesias, signs of intravascular injection Or signs of intrathecal spread were encountered.  Patient was more comfortable after the epidural was dosed. Please see RN's note for documentation of vital signs and FHR which are stable.

## 2014-04-24 NOTE — Progress Notes (Signed)
S:  Here for IOL - understands plan of care       No increase in ctx  / +FM felt regularly / no PIH symptoms  O:  VS: Blood pressure 140/84, pulse 87, temperature 98 F (36.7 C), temperature source Oral, resp. rate 20, height 5\' 7"  (1.702 m), weight 84.823 kg (187 lb), last menstrual period 07/08/2013.        FHR : baseline 125 / variability moderate / accelerations + / no decelerations        Toco: rare contractions        Cervix : 4/90/ vtx / -1 / BBOW        Membranes: AROM - large amount yellow thin MSF                               slow leak down of fluid - vtx well-applied and no cord palpable  A: induction labor     FHR category 1  P: PIH labs pending      AROM induction initiated - reassess in 2 hours           pitocin if no progression into active labor in 4-6 hr - sooner if labs c/w PEC     Marlinda MikeBAILEY, TANYA CNM, MSN, [facility] 04/24/2014, 11:06 AM

## 2014-04-24 NOTE — Progress Notes (Signed)
S:  Pressure with ctx - no pain  O:  VS: Blood pressure 131/76, pulse 93, temperature 98.1 F (36.7 C), temperature source Oral, resp. rate 20, height 5\' 7"  (1.702 m), weight 84.823 kg (187 lb), last menstrual period 07/08/2013, SpO2 100 %, unknown if currently breastfeeding.        FHR : baseline 115/ variability moderate / accelerations + / no decelerations        Toco: contractions every 2-4 minutes / moderate         Cervix : vtx +2        A: second stage of labor     FHR category 1  P: attempt pushing efforts again now with lower station      Dr Juliene PinaMody for attendance at birth   Marlinda MikeBAILEY, Adelfo Diebel CNM, MSN, [facility] 04/24/2014, 8:07 PM

## 2014-04-24 NOTE — Progress Notes (Signed)
Called to come re-evaluate ~ 1330  S:  Painful ctx - intense pain and pressure      backache  O:  VS: Blood pressure 125/94, pulse 97, temperature 98.1 F (36.7 C), temperature source Oral, resp. rate 18, height 5\' 7"  (1.702 m), weight 84.823 kg (187 lb), last menstrual period 07/08/2013, SpO2 99 %.        FHR : intermittent FHR check / baseline 125 / variability moderate / no audible decelerations        Toco: contractions every 2-3 minutes / strong          Cervix : 8cm / 90% / vtx -1 station @ 1345 - repositioned to hands-knees for back pain relief                    10cm / 100% / vtx -1 @ 1406  - labor support in hands-knees / not pushing with ctx - screaming with ctx                 Onset of involuntary pushing with most of ctx and screaming with ctx ~ 1430          Vtx @ 0 station / thin MSF  / voiding moderate amount urine with pushing          Dr Juliene PinaMody in OR updated with status - available if needed - call OR to request her attendance at birth                   Repositioned to lateral, hands-knees, and supine with pushing efforts - 1435 to 1520           1520-1530: loss of control in labor - screaming / crying / "cant do it" / intense bone and back pain that is constant          No fetal descent remains @ 0 station without caput or molding / no movement with maternal efforts          Screaming uncontrollably with acute anxiety - recommended epidural anesthesia at this time - agrees            Dr Juliene PinaMody in room - aware anesthesia en route for epidural placement            Epidural placed @ 1550-1555  A: active labor     FHR reassuring with intermittent FHR check     suspect arrest of descent secondary to fetal size  P: epidural     re-evaluate after patient comfortable     lateral positioning after epidural     OR updated with potential CS  Marlinda MikeBAILEY, Naleigha Raimondi CNM, MSN, [facility] 04/24/2014, 4:17 PM

## 2014-04-24 NOTE — MAU Note (Signed)
Induction for postdates. Waiting for room in BS.

## 2014-04-24 NOTE — H&P (Signed)
OB ADMISSION/ HISTORY & PHYSICAL:  Admission Date: 04/24/2014  7:11 AM  Admit Diagnosis:  41.3 weeks / polyhydramnios / LGA   Dominique Russo is a 34 y.o. female presenting for induction of labor.  Prenatal History: G2P1001   EDC : 04/14/2014, Date entered prior to episode creation  [facility]  Primary Ob Provider: Marlinda Mikeanya Shawnise Peterkin CNM Prenatal course complicated by excessive maternal weight gain (55 lbs) / LGA (9.15 EFW) / polyhydramnios (20-28 range) / post-dates (41+) / mild gestational hypertension without evidence of PEC (labs normal and negative proteinuria)  Prenatal Labs: ABO, Rh:  O positive Antibody:  negative Rubella:   Immune RPR:   NR HBsAg:   negative HIV:   NR GTT: NL GBS:   negative  AFI 28 with BPP 8-8 on 04/23/2014  EFW 9-15 at 41 weeks - proven pelvis to 8-2  Declined IOL since 39 weeks (LGA with mild polyhydramanios / elevated BP / favorable Bishops) - requested to proceed with AROM induction 04/23/2014  Medical / Surgical History :  Past medical history:  Past Medical History  Diagnosis Date  . Fibrocystic breast   . No pertinent past medical history      Past surgical history:  Past Surgical History  Procedure Laterality Date  . No past surgeries      Family History:  Family History  Problem Relation Age of Onset  . Hypertension Mother   . Breast cancer Mother   . Heart attack Father     x 2, CBG x 2, stent  . Hypertension Father   . Anesthesia problems Neg Hx      Social History:  reports that she has never smoked. She does not have any smokeless tobacco history on file. She reports that she does not drink alcohol or use illicit drugs.   Allergies: Tetanus-diphtheria toxoids td   Current Medications at time of admission:  Prior to Admission medications   Medication Sig Start Date End Date Taking? Authorizing Provider  calcium carbonate (TUMS - DOSED IN MG ELEMENTAL CALCIUM) 500 MG chewable  tablet Chew 1 tablet by mouth once.   Yes Historical Provider, MD  Prenatal Vit-Fe Fumarate-FA (PRENATAL MULTIVITAMIN) TABS Take 1 tablet by mouth daily.   Yes Historical Provider, MD    Review of Systems: Active FM Irregular ctx bloody show none  Physical Exam:  VS: Blood pressure 133/80, pulse 78, temperature 98.3 F (36.8 C), temperature source Oral, resp. rate 18, height 5\' 7"  (1.702 m), weight 84.823 kg (187 lb), last menstrual period 07/08/2013.  General: alert and oriented, appears calm / NAD or pain Heart: RRR Lungs: Clear lung fields Abdomen: Gravid, soft and non-tender, non-distended / uterus: gravid with FH size>dates Extremities: trace dependent edema  Genitalia / VE:   4cm / 90% / vtx -1 / BBOW  FHR: baseline rate 125 / variability moderate / accelerations + / no decelerations TOCO: rare ctx  Assessment: 41.[redacted] weeks gestation Induction of labor - postdates / LGA / polyhydramnios / mild gestational hypertension FHR category 1  Plan:  admit AROM induction of labor - declines pitocin initially but agrees if no active labor progression in 4-6 hours to augment with pitocin cautious attempt at SVB - anticipate possible shoulder dystocia - MD available at time of birth Patient notified of risk with poly and LGA - including difficult delivery, shoulder dystocia, increase in lacerations, PPH, cesarean delivery  Dr Cherly Hensenousins notified of admission / plan of care   [place],  Silver Huguenin, MSN, [facility] 04/24/2014, 9:55 AM

## 2014-04-25 ENCOUNTER — Encounter (HOSPITAL_COMMUNITY): Payer: Self-pay

## 2014-04-25 LAB — CBC
HCT: 31.9 % — ABNORMAL LOW (ref 36.0–46.0)
Hemoglobin: 11.1 g/dL — ABNORMAL LOW (ref 12.0–15.0)
MCH: 34 pg (ref 26.0–34.0)
MCHC: 34.8 g/dL (ref 30.0–36.0)
MCV: 97.9 fL (ref 78.0–100.0)
Platelets: 203 10*3/uL (ref 150–400)
RBC: 3.26 MIL/uL — ABNORMAL LOW (ref 3.87–5.11)
RDW: 13.1 % (ref 11.5–15.5)
WBC: 17.8 10*3/uL — ABNORMAL HIGH (ref 4.0–10.5)

## 2014-04-25 LAB — RPR: RPR Ser Ql: NONREACTIVE

## 2014-04-25 NOTE — Anesthesia Postprocedure Evaluation (Signed)
  Anesthesia Post-op Note  Patient: Dominique Russo  Procedure(s) Performed: * No procedures listed *  Patient Location: Mother/Baby  Anesthesia Type:Epidural  Level of Consciousness: awake, alert , oriented and patient cooperative  Airway and Oxygen Therapy: Patient Spontanous Breathing  Post-op Pain: mild  Post-op Assessment: Post-op Vital signs reviewed, Patient's Cardiovascular Status Stable, Respiratory Function Stable, Patent Airway, No headache, No backache, No residual numbness and No residual motor weakness  Post-op Vital Signs: Reviewed and stable  Last Vitals:  Filed Vitals:   04/25/14 0720  BP: 125/84  Pulse: 81  Temp: 36.6 C  Resp: 18    Complications: No apparent anesthesia complications

## 2014-04-25 NOTE — Progress Notes (Signed)
Patient ID: Dominique Russo, female   DOB: 1981-04-09, 34 y.o.   MRNMariane Russo: [id number] PPD # 1 SVD  S:  Reports feeling a little tired and sore             Tolerating po/ No nausea or vomiting             Bleeding is light             Pain controlled with ibuprofen (OTC)             Up ad lib / ambulatory / voiding without difficulties    Newborn  Information for the patient's newborn:  Helane RimaWilliamson, Boy Yaniyah [[id number]][[id number]]  female  breast feeding  / Circumcision NO   O:  A & O x 3, in no apparent distress              VS:  Filed Vitals:   04/24/14 2140 04/24/14 2240 04/25/14 0240 04/25/14 0720  BP: 131/90 130/81 131/80 125/84  Pulse: 109 89 98 81  Temp: 98.8 F (37.1 C) 98.7 F (37.1 C) 98.7 F (37.1 C) 97.9 F (36.6 C)  TempSrc: Oral Oral Oral Oral  Resp: 20 18 18 18   Height:      Weight:      SpO2: 100% 99% 99%     LABS:  Recent Labs  04/24/14 1038 04/25/14 0548  WBC 12.4* 17.8*  HGB 13.1 11.1*  HCT 38.1 31.9*  PLT 216 203    Blood type: --/--/O POS (01/12 1038)  Rubella:     I&O: I/O last 3 completed shifts: In: -  Out: 900 [Urine:450; Blood:450]             Lungs: Clear and unlabored  Heart: regular rate and rhythm / no murmurs  Abdomen: soft, non-tender, non-distended              Fundus: firm, non-tender, U-even  Perineum: 2nd degree repair healing well, mild edema - no ice pack in place  Lochia: minimal  Extremities: trace edema, no calf pain or tenderness, no Homans    A/P: PPD # 1  34 y.o., [id number]   Principal Problem:    Postpartum care following vaginal delivery (1/12)  Active Problems:    LGA (large for gestational age) fetus   Doing well - stable status  Routine post partum orders  Anticipate discharge tomorrow   Raelyn MoraAWSON, Toba Claudio, M, MSN, CNM 04/25/2014, 9:26 AM

## 2014-04-26 MED ORDER — IBUPROFEN 600 MG PO TABS: 600.0000 mg | ORAL_TABLET | Freq: Four times a day (QID) | ORAL | Status: DC

## 2014-04-26 NOTE — Discharge Summary (Signed)
Obstetric Discharge Summary Reason for Admission: induction of labor and postdates Prenatal Course: polyhydramnios, LGA, gestational HTN, excessive weight gain Intrapartum Procedures: spontaneous vaginal delivery Postpartum Procedures: none Complications-Operative and Postpartum: 2nd degree perineal laceration and rt labial laceration HEMOGLOBIN  Date Value Ref Range Status  04/25/2014 11.1* 12.0 - 15.0 g/dL Final   HCT  Date Value Ref Range Status  04/25/2014 31.9* 36.0 - 46.0 % Final    Physical Exam:  General: alert and cooperative Lochia: appropriate Uterine Fundus: firm Incision: healing well, no significant drainage, no dehiscence, no significant erythema DVT Evaluation: No evidence of DVT seen on physical exam. Negative Homan's sign. No cords or calf tenderness. No significant calf/ankle edema.  Discharge Diagnoses: Term Pregnancy-delivered  Discharge Information: Date: 04/26/2014 Activity: pelvic rest Diet: routine Medications: PNV and Ibuprofen Condition: stable Instructions: refer to practice specific booklet Discharge to: home Follow-up Information    Follow up with Marlinda MikeBAILEY, TANYA, CNM. Go in 6 weeks.   Specialty:  Obstetrics and Gynecology   Contact information:   [address] Severe[address] [place] [id number] [phone]       Newborn Data: Live born female on 04/24/14 Birth Weight: 10 lb 0.1 oz (4539 g) APGAR: 8, 9  Home with mother.  Dominique Russo, N 04/26/2014, 9:11 AM

## 2014-04-26 NOTE — Lactation Note (Signed)
This note was copied from the chart of Boy Arley Phenixlizabeth Youse. Lactation Consultation Note     Follow up consust with this mom of an LGA term baby, now 2940 hours old, and at 6% weight loss. He has had 5 voids and stools since birth. Mom has easily expreased colostrum from her left breast. Baby had just fed from right, and I was not able in the little time I tried to express from this side. Mom reports being babe to express easily form both breast. Mom had her first baby lose over a pound in the first 1-2 weeks of life. She ended up pumping and bottle feeding, and does not want to do that this time. I tried to assure mom that this is a different baby, and her milk supply ended up begin very good the last time, and it should be even better this time. The baby is breast feeding well, mom's nipples and breast look great, no discomfort. I also told mom her milk should begin transitioning in between today ans tomorrow. Mom said it took 4 days the last time. Mom was told to call lactation for questions/conerns, and to come in for o/p consult, if needed, Mom anxious, but states she feels more confident that things will go well and better this time.   Patient Name: Boy Arley Phenixlizabeth Poteet RUEAV'WToday's Date: 04/26/2014 Reason for consult: Follow-up assessment   Maternal Data    Feeding Length of feed: 8 min  LATCH Score/Interventions                      Lactation Tools Discussed/Used     Consult Status Consult Status: Complete Follow-up type: Call as needed    Alfred LevinsLee, Waldo Damian Anne 04/26/2014, 11:15 AM

## 2014-04-26 NOTE — Progress Notes (Signed)
PPD #2- SVD  Subjective:   Reports feeling well Tolerating po/ No nausea or vomiting Bleeding is light Pain controlled with Motrin Up ad lib / ambulatory / voiding without problems Newborn: breastfeeding  / Circumcision: not planning   Objective:   VS: VS:  Filed Vitals:   04/25/14 0240 04/25/14 0720 04/25/14 1840 04/26/14 0619  BP: 131/80 125/84 135/82 121/79  Pulse: 98 81 75 84  Temp: 98.7 F (37.1 C) 97.9 F (36.6 C) 98.1 F (36.7 C) 97.6 F (36.4 C)  TempSrc: Oral Oral Oral Oral  Resp: 18 18 18 18   Height:      Weight:      SpO2: 99%   97%    LABS:  Recent Labs  04/24/14 1038 04/25/14 0548  WBC 12.4* 17.8*  HGB 13.1 11.1*  PLT 216 203   Blood type: --/--/O POS (01/12 1038) Rubella:   Immune               I&O: Intake/Output      01/13 0701 - 01/14 0700 01/14 0701 - 01/15 0700   Urine (mL/kg/hr)     Blood     Total Output       Net              Physical Exam: Alert and oriented X3 Abdomen: soft, non-tender, non-distended  Fundus: firm, non-tender, U-2 Perineum: Well approximated, no significant erythema, edema, or drainage; healing well. Lochia: small Extremities: Trace BLE edema, no calf pain or tenderness    Assessment: PPD #2  G2P2002/ S/P:induced vaginal, 2nd degree laceration, rt labial laceration Doing well - stable for discharge home   Plan: Discharge home RX's:  Ibuprofen 600mg  po Q 6 hrs prn pain #30 Refill x 0 Routine pp visit in [employer] booklet given    Donette LarryBHAMBRI, Roy Snuffer, N MSN, CNM 04/26/2014, 9:09 AM

## 2014-05-07 ENCOUNTER — Telehealth: Payer: Self-pay | Admitting: Family Medicine

## 2014-05-07 NOTE — Telephone Encounter (Signed)
Letter to pt regarding her kids Cheree DittoGraham and Luciana AxeZella - will not be able to see them due to their decision not to immunize

## 2015-01-29 ENCOUNTER — Telehealth: Payer: Self-pay | Admitting: Family Medicine

## 2015-01-29 NOTE — Telephone Encounter (Signed)
[facility] Client TELEPHONE ADVICE RECORD [facility] Patient Name: Dominique Russo ON DOB: [birth year] Initial Comment Caller states she had severe upper abdominal pain for 45 mins, then it went away, first occurance Nurse Assessment Nurse: Roderic OvensNorth, RN, Amy Date/Time (Eastern Time): 01/29/2015 2:31:07 PM Confirm and document reason for call. If symptomatic, describe symptoms. ---SHE STATES THAT SHE HAD SOME SEVERE UPPER ABD PAIN FOR ABOUT 45 AND THEN IT WENT AWAY. SHE STATES SHE WAS LAYING IN THE FLOOR AND SHE IT WENT AWAY. RIGHT UPPER ABD PAIN. UPPER ABD PAIN INTO THE RIGHT SIDE OF THE BACK. SHE STATES 7-8 ON PAIN SCALE. SHARP STABBING PAIN. SHE HAS HER APPENDIX STILL. NO FEVER RECENTLY. SHE STATES THAT IT WAS SO SEVERE THAT SHE THOUGHT SHE COULD PASS OUT. Has the patient traveled out of the country within the last 30 days? ---Not Applicable Does the patient have any new or worsening symptoms? ---Yes Will a triage be completed? ---Yes Related visit to physician within the last 2 weeks? ---No Does the PT have any chronic conditions? (i.e. diabetes, asthma, etc.) ---No Did the patient indicate they were pregnant? ---No Guidelines Guideline Title Affirmed Question Affirmed Notes Abdominal Pain - Female [1] MODERATE (e.g., interferes with normal activities) AND [2] pain comes and goes (cramps) AND [3] present > 24 hours (Exception: pain with Vomiting or Diarrhea - see that Guideline) Final Disposition User See Physician within 24 Hours JenkinsNorth, RN, Amy Referrals REFERRED TO PCP OFFICE Disagree/Comply: Comply

## 2015-01-29 NOTE — Telephone Encounter (Signed)
F/u appt scheduled with Dr. Milinda Antisower, and pt notified to go to ER if sxs worsen before appt

## 2015-01-29 NOTE — Telephone Encounter (Signed)
Please schedule f/u when able - ? If possible gallstones  If pain returns before appt- go to ED please (esp if severe)

## 2015-01-30 ENCOUNTER — Ambulatory Visit (INDEPENDENT_AMBULATORY_CARE_PROVIDER_SITE_OTHER): Payer: BLUE CROSS/BLUE SHIELD | Admitting: Family Medicine

## 2015-01-30 ENCOUNTER — Encounter: Payer: Self-pay | Admitting: Family Medicine

## 2015-01-30 VITALS — BP 124/82 | HR 75 | Temp 98.1°F | Ht 67.0 in | Wt 134.8 lb

## 2015-01-30 DIAGNOSIS — R109 Unspecified abdominal pain: Secondary | ICD-10-CM | POA: Diagnosis not present

## 2015-01-30 DIAGNOSIS — R1011 Right upper quadrant pain: Secondary | ICD-10-CM | POA: Diagnosis not present

## 2015-01-30 LAB — POCT URINALYSIS DIPSTICK
Bilirubin, UA: NEGATIVE
Blood, UA: NEGATIVE
GLUCOSE UA: NEGATIVE
KETONES UA: NEGATIVE
Leukocytes, UA: NEGATIVE
Nitrite, UA: NEGATIVE
Protein, UA: NEGATIVE
SPEC GRAV UA: 1.01
UROBILINOGEN UA: 0.2
pH, UA: 6

## 2015-01-30 NOTE — Assessment & Plan Note (Addendum)
45 min of severe disabling pain in RUQ and R flank yesterday without trigger No assoc urinary or GI symptoms at all  Nl exam now-no tenderness /so doubt muscle spasm  Nl ua  Will order abd US to check for gallstones  Will update and seek care in the meantime if this re occurs

## 2015-01-30 NOTE — Progress Notes (Signed)
Pre visit review using our clinic review tool, if applicable. No additional management support is needed unless otherwise documented below in the visit note. 

## 2015-01-30 NOTE — Assessment & Plan Note (Signed)
45 min of severe disabling pain in RUQ and R flank yesterday without trigger No assoc urinary or GI symptoms at all  Nl exam now-no tenderness /so doubt muscle spasm  Nl ua  Will order abd US to check for gallstones  Will update and seek care in the meantime if this re occurs    

## 2015-01-30 NOTE — Patient Instructions (Signed)
I think you most likely passed a kidney stone or had gallbladder pain  Urine is clear today-reassuring  I want to check you for gallstones   Please stop up front for referral for abdominal ultrasound    If this occurs again -please alert us   Make sure to drink enough water

## 2015-01-30 NOTE — Progress Notes (Signed)
Subjective:    Patient ID: Dominique Russo, female    DOB: 07/07/1980, 34 y.o.   MRN: [id number]  HPI Here for an episode of abdominal pain   Yesterday -it occurred at 12:45 - was giving child her lunch  Her R side hurt- thought it was a muscle cramp  Then got worse- stretched around to her back  Then it got severe and she fell down on the floor - was worried she was going to pass out  Her mother came over and got her to the sofa   Had last eaten oatmeal for bkfast and then some cashews and pb and honey   No n/v Was a little clammy  No diarrhea or constipation   No urinary symptoms   No hx of gallstones or kidney stones    Patient Active Problem List   Diagnosis Date Noted  . LGA (large for gestational age) fetus 04/24/2014  . Postpartum care following vaginal delivery (1/12) 04/24/2014  . ARTHRALGIA 11/21/2009  . FIBROCYSTIC BREAST DISEASE 05/18/2007  . PSORIASIS 05/18/2007   Past Medical History  Diagnosis Date  . Fibrocystic breast   . No pertinent past medical history    Past Surgical History  Procedure Laterality Date  . No past surgeries     Social History  Substance Use Topics  . Smoking status: Never Smoker   . Smokeless tobacco: None  . Alcohol Use: No   Family History  Problem Relation Age of Onset  . Hypertension Mother   . Breast cancer Mother   . Heart attack Father     x 2, CBG x 2, stent  . Hypertension Father   . Anesthesia problems Neg Hx    Allergies  Allergen Reactions  . Tetanus-Diphtheria Toxoids Td     REACTION: painful local reaction 4/09   No current outpatient prescriptions on file prior to visit.   No current facility-administered medications on file prior to visit.       Was severe if she moved  Stayed still  Lasted 45 minutes  Since then felt fine   No more pain   Results for orders placed or performed in visit on 01/30/15  POCT urinalysis dipstick  Result Value Ref Range   Color, UA Straw    Clarity,  UA Clear    Glucose, UA Neg.    Bilirubin, UA Neg.    Ketones, UA Neg.    Spec Grav, UA 1.010    Blood, UA Neg.    pH, UA 6.0    Protein, UA Neg.    Urobilinogen, UA 0.2    Nitrite, UA Neg.    Leukocytes, UA Negative Negative    Pt states no chance pregnant-not sexually active currently  Patient Active Problem List   Diagnosis Date Noted  . LGA (large for gestational age) fetus 04/24/2014  . Postpartum care following vaginal delivery (1/12) 04/24/2014  . ARTHRALGIA 11/21/2009  . FIBROCYSTIC BREAST DISEASE 05/18/2007  . PSORIASIS 05/18/2007   Past Medical History  Diagnosis Date  . Fibrocystic breast   . No pertinent past medical history    Past Surgical History  Procedure Laterality Date  . No past surgeries     Social History  Substance Use Topics  . Smoking status: Never Smoker   . Smokeless tobacco: None  . Alcohol Use: No   Family History  Problem Relation Age of Onset  . Hypertension Mother   . Breast cancer Mother   . Heart attack Father  x 2, CBG x 2, stent  . Hypertension Father   . Anesthesia problems Neg Hx    Allergies  Allergen Reactions  . Tetanus-Diphtheria Toxoids Td     REACTION: painful local reaction 4/09   No current outpatient prescriptions on file prior to visit.   No current facility-administered medications on file prior to visit.    Review of Systems Review of Systems  Constitutional: Negative for fever, appetite change, fatigue and unexpected weight change.  Eyes: Negative for pain and visual disturbance.  Respiratory: Negative for cough and shortness of breath.   Cardiovascular: Negative for cp or palpitations    Gastrointestinal: Negative for nausea, diarrhea and constipation. pos for abd/side pain yesterday that is now gone  Genitourinary: Negative for urgency and frequency. neg for hematuria or dysuria  Skin: Negative for pallor or rash   Neurological: Negative for weakness, light-headedness, numbness and headaches.    Hematological: Negative for adenopathy. Does not bruise/bleed easily.  Psychiatric/Behavioral: Negative for dysphoric mood. The patient is not nervous/anxious.         Objective:   Physical Exam  Constitutional: She appears well-developed and well-nourished. No distress.  Well appearing Not in pain or distress  HENT:  Head: Normocephalic and atraumatic.  Eyes: Conjunctivae and EOM are normal. Pupils are equal, round, and reactive to light. No scleral icterus.  Neck: Normal range of motion. Neck supple.  Cardiovascular: Normal rate, regular rhythm and normal heart sounds.   Pulmonary/Chest: Effort normal and breath sounds normal. No respiratory distress. She has no wheezes. She has no rales.  Abdominal: Soft. Bowel sounds are normal. She exhibits no distension, no abdominal bruit, no pulsatile midline mass and no mass. There is no hepatosplenomegaly. There is no tenderness. There is no rebound, no guarding, no CVA tenderness, no tenderness at McBurney's point and negative Murphy's sign.  No cva tenderness  No suprapubic tenderness  Musculoskeletal: She exhibits no edema.  Lymphadenopathy:    She has no cervical adenopathy.  Neurological: She is alert.  Skin: No rash noted.  Psychiatric: She has a normal mood and affect.          Assessment & Plan:   Problem List Items Addressed This Visit      Other   Abdominal pain, right upper quadrant - Primary    45 min of severe disabling pain in RUQ and R flank yesterday without trigger No assoc urinary or GI symptoms at all  Nl exam now-no tenderness /so doubt muscle spasm  Nl ua  Will order abd [place] to check for gallstones  Will update and seek care in the meantime if this re occurs       Relevant Orders   US Abdomen Complete   Right flank pain    45 min of severe disabling pain in RUQ and R flank yesterday without trigger No assoc urinary or GI symptoms at all  Nl exam now-no tenderness /so doubt muscle spasm  Nl ua  Will  order abd [place] to check for gallstones  Will update and seek care in the meantime if this re occurs         Relevant Orders   POCT urinalysis dipstick (Completed)

## 2015-02-04 ENCOUNTER — Ambulatory Visit
Admission: RE | Admit: 2015-02-04 | Discharge: 2015-02-04 | Disposition: A | Discharge status: Home / Self Care | Payer: BLUE CROSS/BLUE SHIELD | Source: Ambulatory Visit | Attending: Family Medicine | Admitting: Family Medicine

## 2015-02-04 DIAGNOSIS — R1011 Right upper quadrant pain: Secondary | ICD-10-CM | POA: Diagnosis not present

## 2016-05-15 LAB — HM PAP SMEAR

## 2016-06-10 ENCOUNTER — Ambulatory Visit (INDEPENDENT_AMBULATORY_CARE_PROVIDER_SITE_OTHER): Payer: BLUE CROSS/BLUE SHIELD | Admitting: Family Medicine

## 2016-06-10 ENCOUNTER — Encounter: Payer: Self-pay | Admitting: Family Medicine

## 2016-06-10 ENCOUNTER — Encounter (INDEPENDENT_AMBULATORY_CARE_PROVIDER_SITE_OTHER): Payer: Self-pay

## 2016-06-10 DIAGNOSIS — R5382 Chronic fatigue, unspecified: Secondary | ICD-10-CM

## 2016-06-10 DIAGNOSIS — R5383 Other fatigue: Secondary | ICD-10-CM | POA: Insufficient documentation

## 2016-06-10 DIAGNOSIS — F53 Postpartum depression: Secondary | ICD-10-CM | POA: Insufficient documentation

## 2016-06-10 DIAGNOSIS — O99345 Other mental disorders complicating the puerperium: Secondary | ICD-10-CM

## 2016-06-10 DIAGNOSIS — R350 Frequency of micturition: Secondary | ICD-10-CM | POA: Diagnosis not present

## 2016-06-10 LAB — POC URINALSYSI DIPSTICK (AUTOMATED)
Bilirubin, UA: NEGATIVE
Glucose, UA: NEGATIVE
Ketones, UA: NEGATIVE
LEUKOCYTES UA: NEGATIVE
NITRITE UA: NEGATIVE
PH UA: 7.5
PROTEIN UA: NEGATIVE
RBC UA: NEGATIVE
Spec Grav, UA: 1.015
UROBILINOGEN UA: NEGATIVE

## 2016-06-10 LAB — GLUCOSE, POCT (MANUAL RESULT ENTRY): POC GLUCOSE: 92 mg/dL (ref 70–99)

## 2016-06-10 NOTE — Progress Notes (Signed)
Pre visit review using our clinic review tool, if applicable. No additional management support is needed unless otherwise documented below in the visit note. 

## 2016-06-10 NOTE — Progress Notes (Signed)
Subjective:    Patient ID: Dominique Russo, female    DOB: 1980-11-05, 36 y.o.   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 Fatigue 06/10/2016  . 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 Urine frequency 06/10/2016  . Right flank pain 01/30/2015  . Abdominal pain, right upper quadrant 01/30/2015  . LGA (large for gestational age) fetus 04/24/2014  . Postpartum care following vaginal delivery (1/12) 04/24/2014  . ARTHRALGIA 11/21/2009  . FIBROCYSTIC BREAST DISEASE 05/18/2007  . PSORIASIS 05/18/2007   Past Medical History:  Diagnosis Date  . Fibrocystic breast   . No pertinent past medical history    Past Surgical History:  Procedure Laterality Date  . NO PAST SURGERIES     Social History  Substance Use Topics  . Smoking status: Never Smoker  . Smokeless tobacco: Never Used  . Alcohol use No   Family History  Problem Relation Age of Onset  . Hypertension Mother   . Breast cancer Mother   . Heart  attack Father     x 2, CBG x 2, stent  . Hypertension Father   . Anesthesia problems Neg Hx    Allergies  Allergen Reactions  . Tetanus-Diphtheria Toxoids Td     REACTION: painful local reaction 4/09   No current outpatient prescriptions on file prior to visit.   No current facility-administered medications on file prior to visit.     Review of Systems Review of Systems  Constitutional: Negative for fever, appetite change,  and unexpected weight change. pos for general fatigue and malaise Eyes: Negative for pain and visual disturbance.  Respiratory: Negative for cough and shortness of breath.   Cardiovascular: Negative for cp or palpitations    Gastrointestinal: Negative for nausea, diarrhea and constipation.  Genitourinary: Negative for urgency and pos for frequency. neg for dysuria  Skin: Negative for pallor or rash   Neurological: Negative for weakness, light-headedness, numbness and headaches.  Hematological: Negative for adenopathy. Does not bruise/bleed easily.  Psychiatric/Behavioral: pos for anxious an dysphoric mood         Objective:   Physical Exam  Constitutional: She appears well-developed and well-nourished. No distress.  Slim and well appearing   HENT:  Head: Normocephalic  and atraumatic.  Mouth/Throat: Oropharynx is clear and moist.  Eyes: Conjunctivae and EOM are normal. Pupils are equal, round, and reactive to light. Right eye exhibits no discharge. Left eye exhibits no discharge. No scleral icterus.  Neck: Normal range of motion. Neck supple. No JVD present. Carotid bruit is not present. No thyromegaly present.  Cardiovascular: Normal rate, regular rhythm, normal heart sounds and intact distal pulses.  Exam reveals no gallop.   Pulmonary/Chest: Effort normal and breath sounds normal. No respiratory distress. She has no wheezes. She has no rales.  No crackles  Abdominal: Soft. Bowel sounds are normal. She exhibits no distension, no abdominal bruit and no  mass. There is no tenderness.  Musculoskeletal: She exhibits no edema or tenderness.  Lymphadenopathy:    She has no cervical adenopathy.  Neurological: She is alert. She has normal reflexes. No cranial nerve deficit. She exhibits normal muscle tone. Coordination normal.  Skin: Skin is warm and dry. No rash noted. No pallor.  Psychiatric: Her speech is normal and behavior is normal. Her mood appears anxious. Her affect is not blunt, not labile and not inappropriate. She does not exhibit a depressed mood. She expresses no suicidal ideation.  Pleasant and talkative  Mildly anxious           Assessment & Plan:   Problem List Items Addressed This Visit      Other   Fatigue    May be multi factorial  Suspect post partum depression may be one cause Disc polydipsia/polyuria Nl glucose Nl ua  Labs ordered for fatigue and freq urination incl A1C       Relevant Orders   Glucose (CBG) (Completed)   CBC with Differential/Platelet (Completed)   Comprehensive metabolic panel (Completed)   TSH (Completed)   Vitamin B12 (Completed)   VITAMIN D 25 Hydroxy (Vit-D Deficiency, Fractures) (Completed)   Post partum depression    This could be causing fatigue and malaise  Enc her to continue counseling  Disc imp of self care Will work on making time for this  Reviewed stressors/ coping techniques/symptoms/ support sources/ tx options and side effects in detail today  She declines medication      Urine frequency    With fatigue and malaise  UA neg Random glucose of 92  Pending labs with A1C Good fluid intake

## 2016-06-10 NOTE — Patient Instructions (Signed)
Continue counseling  Start creatively making some time for self care and sleep- use all the support you have  Labs today  Urinalysis also  Blood glucose right now is good

## 2016-06-11 LAB — COMPREHENSIVE METABOLIC PANEL
ALT: 23 U/L (ref 0–35)
AST: 19 U/L (ref 0–37)
Albumin: 4.8 g/dL (ref 3.5–5.2)
Alkaline Phosphatase: 57 U/L (ref 39–117)
BUN: 11 mg/dL (ref 6–23)
CO2: 27 mEq/L (ref 19–32)
Calcium: 10 mg/dL (ref 8.4–10.5)
Chloride: 105 mEq/L (ref 96–112)
Creatinine, Ser: 0.9 mg/dL (ref 0.40–1.20)
GFR: 75.29 mL/min (ref >60.00)
GLUCOSE: 71 mg/dL (ref 70–99)
Potassium: 3.7 mEq/L (ref 3.5–5.1)
Sodium: 139 mEq/L (ref 135–145)
Total Bilirubin: 0.6 mg/dL (ref 0.2–1.2)
Total Protein: 7.7 g/dL (ref 6.0–8.3)

## 2016-06-11 LAB — CBC WITH DIFFERENTIAL/PLATELET
BASOS ABS: 0.1 10*3/uL (ref 0.0–0.1)
Basophils Relative: 1 % (ref 0.0–3.0)
Eosinophils Absolute: 0.2 10*3/uL (ref 0.0–0.7)
Eosinophils Relative: 1.8 % (ref 0.0–5.0)
HCT: 41.2 % (ref 36.0–46.0)
Hemoglobin: 13.8 g/dL (ref 12.0–15.0)
LYMPHS ABS: 3.6 10*3/uL (ref 0.7–4.0)
Lymphocytes Relative: 35.9 % (ref 12.0–46.0)
MCHC: 33.4 g/dL (ref 30.0–36.0)
MCV: 93.9 fl (ref 78.0–100.0)
MONOS PCT: 7.3 % (ref 3.0–12.0)
Monocytes Absolute: 0.7 10*3/uL (ref 0.1–1.0)
NEUTROS ABS: 5.5 10*3/uL (ref 1.4–7.7)
NEUTROS PCT: 54 % (ref 43.0–77.0)
PLATELETS: 321 10*3/uL (ref 150.0–400.0)
RBC: 4.39 Mil/uL (ref 3.87–5.11)
RDW: 13.3 % (ref 11.5–15.5)
WBC: 10.1 10*3/uL (ref 4.0–10.5)

## 2016-06-11 LAB — TSH: TSH: 1.49 u[IU]/mL (ref 0.35–4.50)

## 2016-06-11 LAB — VITAMIN B12: Vitamin B-12: 408 pg/mL (ref 211–911)

## 2016-06-11 LAB — VITAMIN D 25 HYDROXY (VIT D DEFICIENCY, FRACTURES): VITD: 19.31 ng/mL — AB (ref 30.00–100.00)

## 2016-06-11 NOTE — Assessment & Plan Note (Signed)
With fatigue and malaise  UA neg Random glucose of 92  Pending labs with A1C Good fluid intake

## 2016-06-11 NOTE — Assessment & Plan Note (Signed)
May be multi factorial  Suspect post partum depression may be one cause Disc polydipsia/polyuria Nl glucose Nl ua  Labs ordered for fatigue and freq urination incl A1C

## 2016-06-11 NOTE — Assessment & Plan Note (Signed)
This could be causing fatigue and malaise  Enc her to continue counseling  Disc imp of self care Will work on making time for this  Reviewed stressors/ coping techniques/symptoms/ support sources/ tx options and side effects in detail today  She declines medication

## 2016-10-29 ENCOUNTER — Telehealth: Payer: Self-pay | Admitting: Family Medicine

## 2016-10-29 MED ORDER — FIRST-DUKES MOUTHWASH MT SUSP: OROMUCOSAL | 0 refills | Status: DC

## 2016-10-29 NOTE — Telephone Encounter (Signed)
Sent but if not better the f/u.  Thanks.

## 2016-10-29 NOTE — Telephone Encounter (Signed)
Patient Name: Dominique Russo  DOB: Feb 04, 1981    Initial Comment caller states she has canker sores in mouth and down throat . She wants dukes magic mouthwash called in    Nurse Assessment      Guidelines    Guideline Title Affirmed Question Affirmed Notes       Final Disposition User   FINAL ATTEMPT MADE - no message left Stefano GaulStringer, RN, Dwana CurdVera    Comments  attempted to return call and line is busy. Will close call as 3 attempts have been made.

## 2016-10-29 NOTE — Telephone Encounter (Signed)
Left detailed message on voicemail.  

## 2016-10-29 NOTE — Telephone Encounter (Signed)
Dr Milinda Antisower does not have computer access today. Pt said that she [facility] about 4 yrs ago with same sores in her mouth and throat and was given dukes magic mouthwash; pt is uncomfortable and request [facility]. Pt did not want to schedule appt.Please advise.

## 2017-02-05 ENCOUNTER — Encounter: Payer: Self-pay | Admitting: Family Medicine

## 2017-02-05 ENCOUNTER — Ambulatory Visit (INDEPENDENT_AMBULATORY_CARE_PROVIDER_SITE_OTHER): Payer: BLUE CROSS/BLUE SHIELD | Admitting: Family Medicine

## 2017-02-05 VITALS — BP 108/70 | HR 75 | Temp 98.1°F | Ht 67.0 in | Wt 140.0 lb

## 2017-02-05 DIAGNOSIS — J0100 Acute maxillary sinusitis, unspecified: Secondary | ICD-10-CM

## 2017-02-05 DIAGNOSIS — J019 Acute sinusitis, unspecified: Secondary | ICD-10-CM | POA: Insufficient documentation

## 2017-02-05 MED ORDER — AMOXICILLIN-POT CLAVULANATE 875-125 MG PO TABS: 1.0000 | ORAL_TABLET | Freq: Two times a day (BID) | ORAL | 0 refills | Status: DC

## 2017-02-05 NOTE — Progress Notes (Signed)
   Subjective:    Patient ID: Dominique Russo, female    DOB: [birth year], 36 y.o.   MRN: [id number]  HPI Here for sinus symptoms   Sick for 2 weeks with uri symptoms   (husband gave it to her)  Head hurts/heavy  Thick green nasal mucous with congestion  Post nasal drip with mild st  Ears pop/full   Temp: 98.1 F (36.7 C)  No fever  A little tired   Has not taken much otc  Took mucinex 2 d   Some cough -not bad    Review of Systems  Constitutional: Positive for appetite change. Negative for fatigue and fever.  HENT: Positive for congestion, ear pain, postnasal drip, rhinorrhea, sinus pressure and sore throat. Negative for nosebleeds.   Eyes: Negative for pain, redness and itching.  Respiratory: Positive for cough. Negative for shortness of breath and wheezing.   Cardiovascular: Negative for chest pain.  Gastrointestinal: Negative for abdominal pain, diarrhea, nausea and vomiting.  Endocrine: Negative for polyuria.  Genitourinary: Negative for dysuria, frequency and urgency.  Musculoskeletal: Negative for arthralgias and myalgias.  Allergic/Immunologic: Negative for immunocompromised state.  Neurological: Positive for headaches. Negative for dizziness, tremors, syncope, weakness and numbness.  Hematological: Negative for adenopathy. Does not bruise/bleed easily.  Psychiatric/Behavioral: Negative for dysphoric mood. The patient is not nervous/anxious.        Objective:   Physical Exam  Constitutional: She appears well-developed and well-nourished. No distress.  HENT:  Head: Normocephalic and atraumatic.  Right Ear: External ear normal.  Left Ear: External ear normal.  Mouth/Throat: Oropharynx is clear and moist. No oropharyngeal exudate.  Nares are injected and congested  Bilateral maxillary sinus tenderness  Post nasal drip   Eyes: Pupils are equal, round, and reactive to light. Conjunctivae and EOM are normal. Right eye exhibits no discharge. Left eye exhibits  no discharge.  Neck: Normal range of motion. Neck supple.  Cardiovascular: Normal rate and regular rhythm.   Pulmonary/Chest: Effort normal and breath sounds normal. No respiratory distress. She has no wheezes. She has no rales.  Lymphadenopathy:    She has no cervical adenopathy.  Neurological: She is alert. No cranial nerve deficit.  Skin: Skin is warm and dry. No rash noted.  Psychiatric: She has a normal mood and affect.          Assessment & Plan:   Problem List Items Addressed This Visit      Respiratory   Acute sinusitis    Day 14 after a viral uri augmentin  Fluids/ nasal saline/rest Disc symptomatic care - see instructions on AVS  Update if not starting to improve in a week or if worsening        Relevant Medications   amoxicillin-clavulanate (AUGMENTIN) 875-125 MG tablet

## 2017-02-05 NOTE — Patient Instructions (Signed)
You have a sinus infection  Get some nasal saline to help irrigate sinuses or netti pot Rest  Drink fluids Breathe steam  Take the augmentin as directed   Update if not starting to improve in a week or if worsening

## 2017-02-05 NOTE — Assessment & Plan Note (Signed)
Day 14 after a viral uri augmentin  Fluids/ nasal saline/rest Disc symptomatic care - see instructions on AVS  Update if not starting to improve in a week or if worsening

## 2017-03-24 ENCOUNTER — Encounter: Payer: Self-pay | Admitting: Family Medicine

## 2017-03-24 ENCOUNTER — Ambulatory Visit: Payer: BLUE CROSS/BLUE SHIELD | Admitting: Family Medicine

## 2017-03-24 VITALS — BP 138/80 | HR 86 | Temp 98.1°F | Ht 67.0 in | Wt 133.5 lb

## 2017-03-24 DIAGNOSIS — F418 Other specified anxiety disorders: Secondary | ICD-10-CM | POA: Diagnosis not present

## 2017-03-24 MED ORDER — SERTRALINE HCL 50 MG PO TABS: 50.0000 mg | ORAL_TABLET | Freq: Every day | ORAL | 5 refills | Status: DC

## 2017-03-24 NOTE — Assessment & Plan Note (Signed)
With significant stressors  Seeing a counselor  Reviewed stressors/ coping techniques/symptoms/ support sources/ tx options and side effects in detail today  No SI  ? If some PTSD Will continue counseling Start zolfot 25 mg -titrate to 50 mg as tolerated Discussed expectations of SSRI medication including time to effectiveness and mechanism of action, also poss of side effects (early and late)- including mental fuzziness, weight or appetite change, nausea and poss of worse dep or anxiety (even suicidal thoughts)  Pt voiced understanding and will stop med and update if this occurs   F/u in approx 1 mo  >25 minutes spent in face to face time with patient, >50% spent in counselling or coordination of care-incl detailed disc of symptoms and coping skills  Also disc beginning meditation and better self care as able
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
## 2017-03-24 NOTE — Patient Instructions (Signed)
Start zoloft (sertraline) 1/2 pill once daily in the afternoon or evening for a week and then if doing ok increase to 1 pill a day  Any intolerable side effects ( more anxious or depressed)- stop it an let me know   Try to be good to yourself , get outdoors a little if you can  Exercise helps also  Meditation really helps  (I like the Breethe app)    Continue counseling   Follow up in about a month

## 2017-03-25 ENCOUNTER — Telehealth: Payer: Self-pay | Admitting: Family Medicine

## 2017-03-25 MED ORDER — ALPRAZOLAM 0.5 MG PO TABS: 0.5000 mg | ORAL_TABLET | Freq: Two times a day (BID) | ORAL | 0 refills | Status: DC | PRN

## 2017-03-25 NOTE — Telephone Encounter (Signed)
Rx called in as prescribed 

## 2017-03-25 NOTE — Telephone Encounter (Signed)
No mention of Xanax to be started in the OV from yesterday with Dr. Milinda Antisower.

## 2017-03-25 NOTE — Telephone Encounter (Signed)
I forgot to add that  Please call it in  sorry

## 2017-03-25 NOTE — Telephone Encounter (Signed)
Copied from CRM [id number]. Topic: Quick Communication - See Telephone Encounter >> Mar 25, 2017  8:53 AM Oneal GroutSebastian, Jennifer S wrote: CRM for notification. See Telephone encounter for:  Patient was seen yesterday by Dr Milinda Antisower, states she was suppose to call in xanax, pharmacy did not receive it, only zoloft. [facility] 03/25/17.

## 2017-03-27 IMAGING — US US ABDOMEN COMPLETE
1 series · 14 of 25 positions shown · non-contrast
Comparison: None in PACs

CLINICAL DATA: Right upper quadrant and right flank pain since
January 30, 2015

EXAM:
ULTRASOUND ABDOMEN COMPLETE

[Series 1: us abdomen complete · 0.14mm/px · 14 of 81 slices shown]
[im 1/81]
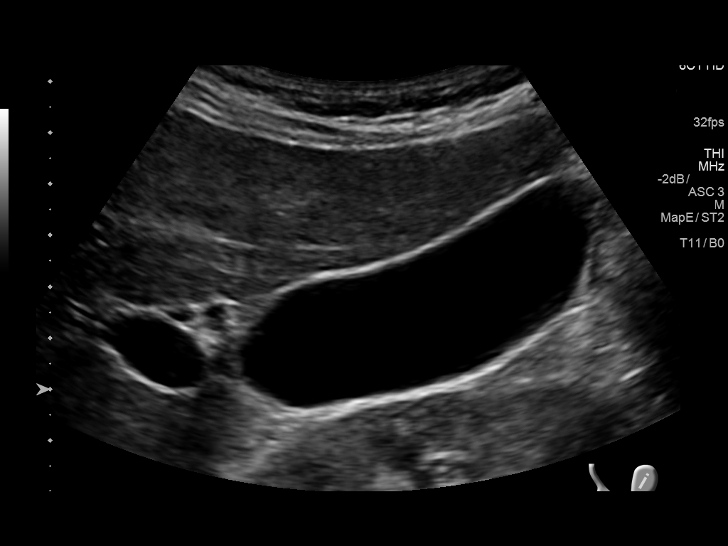
[im 7/81]
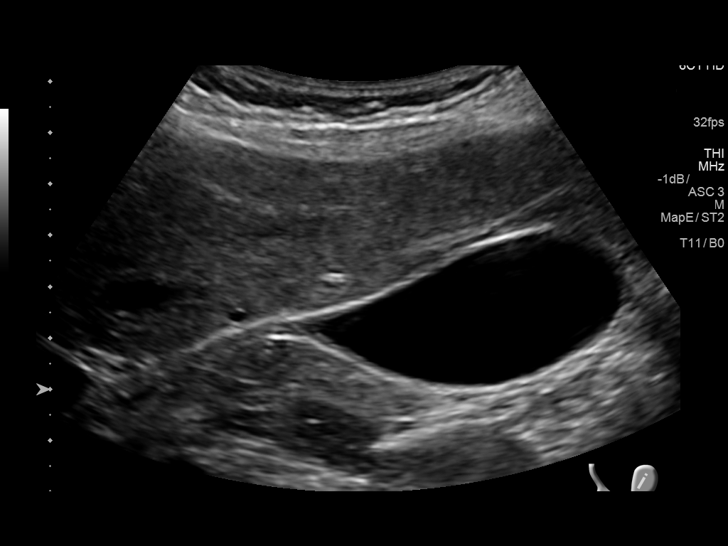
[im 14/81]
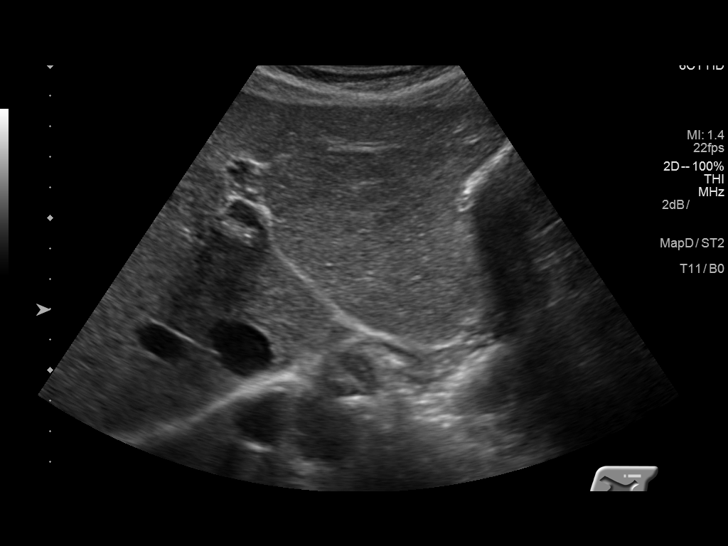
[im 21/81]
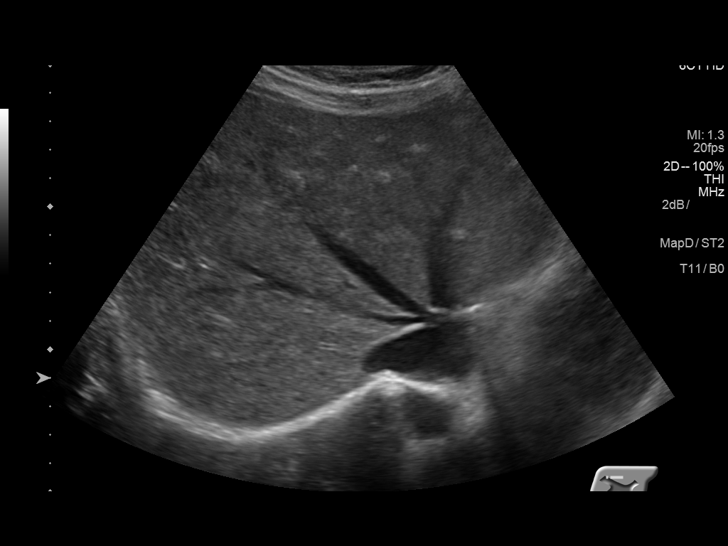
[im 27/81]
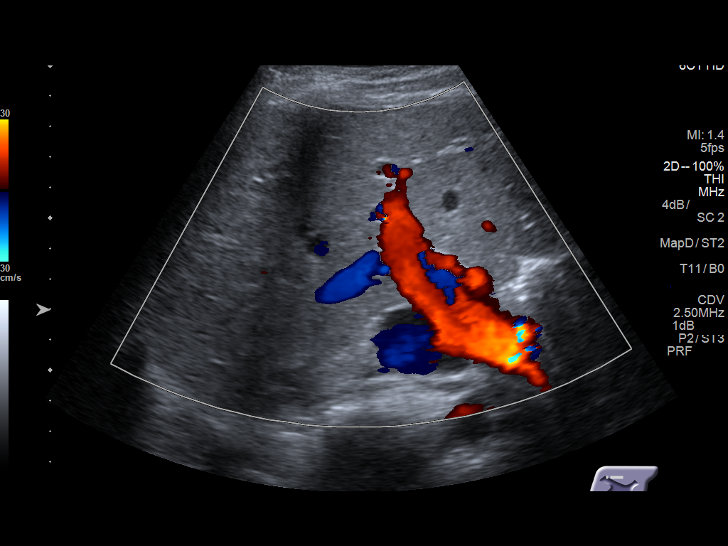
[im 31/81]
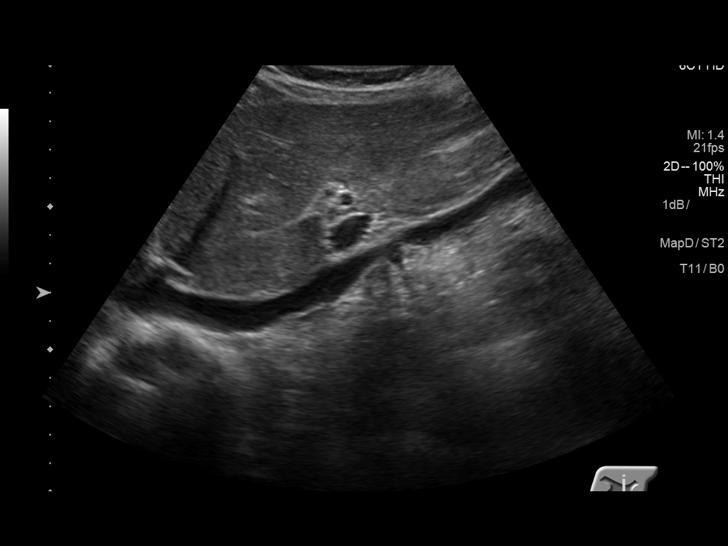
[im 37/81]
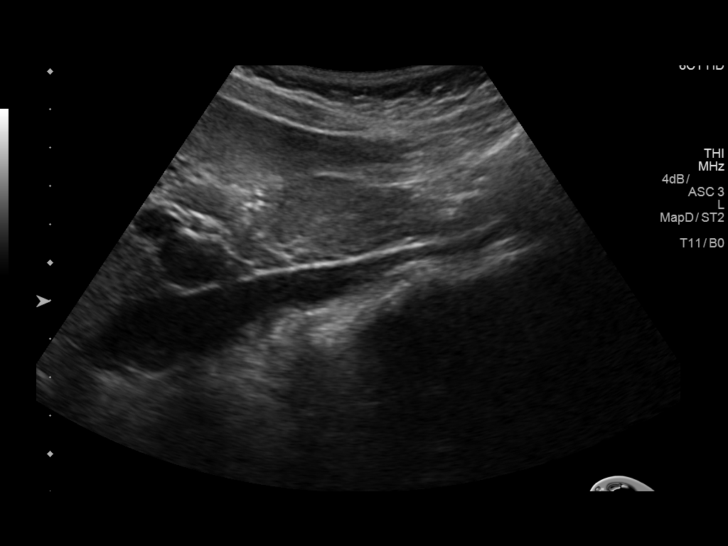
[im 44/81]
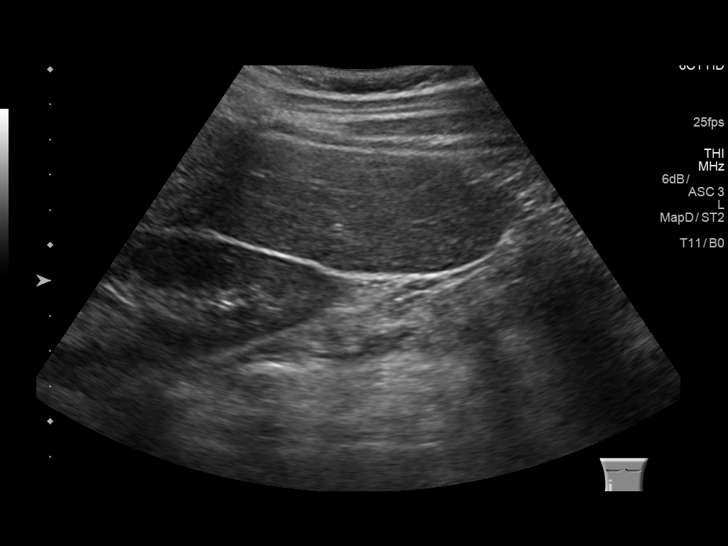
[im 51/81]
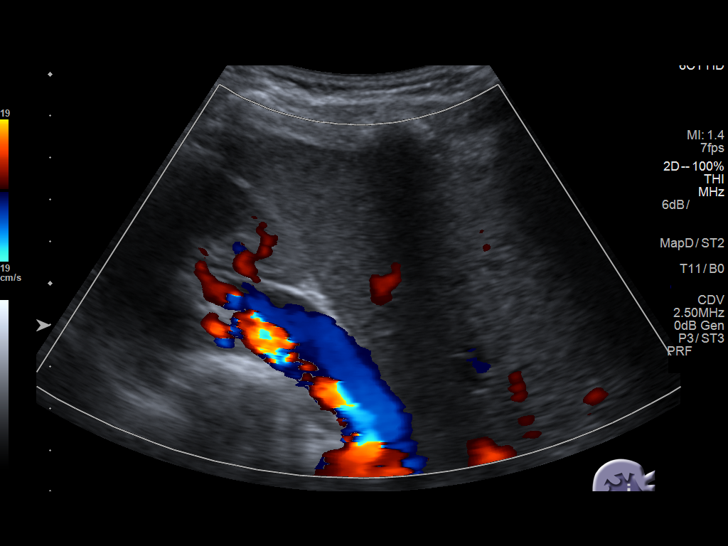
[im 54/81]
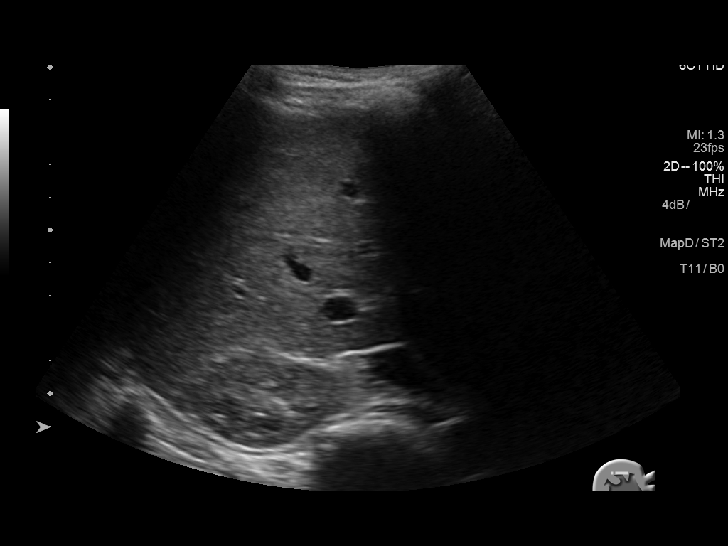
[im 61/81]
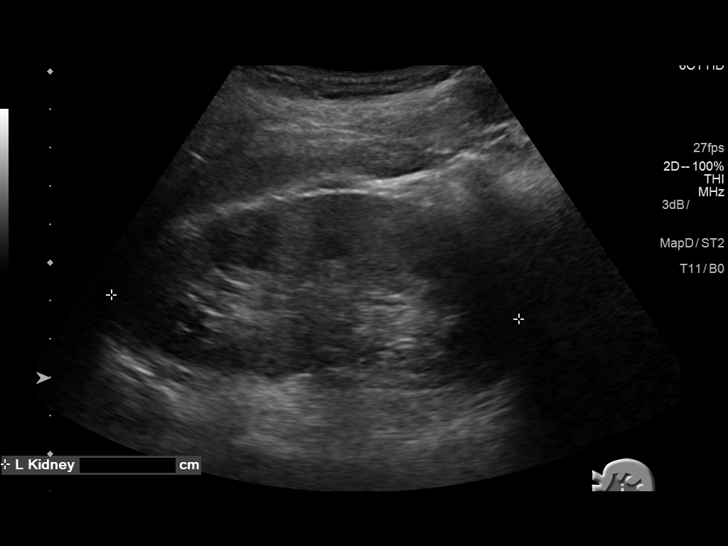
[im 67/81]
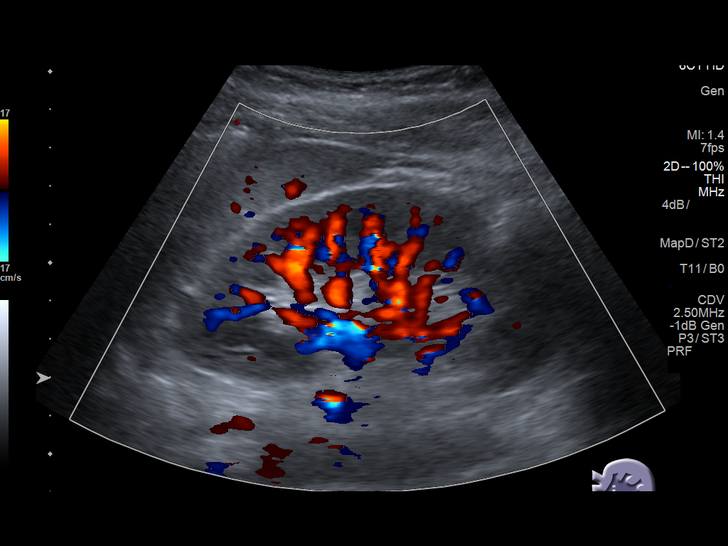
[im 74/81]
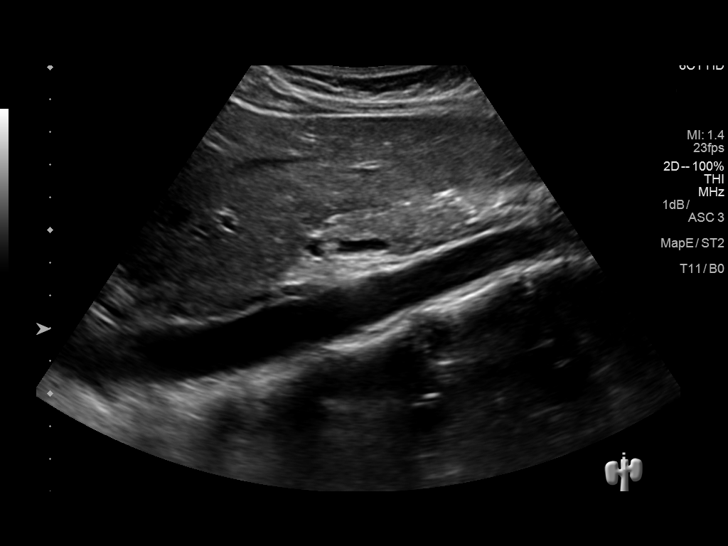
[im 81/81]
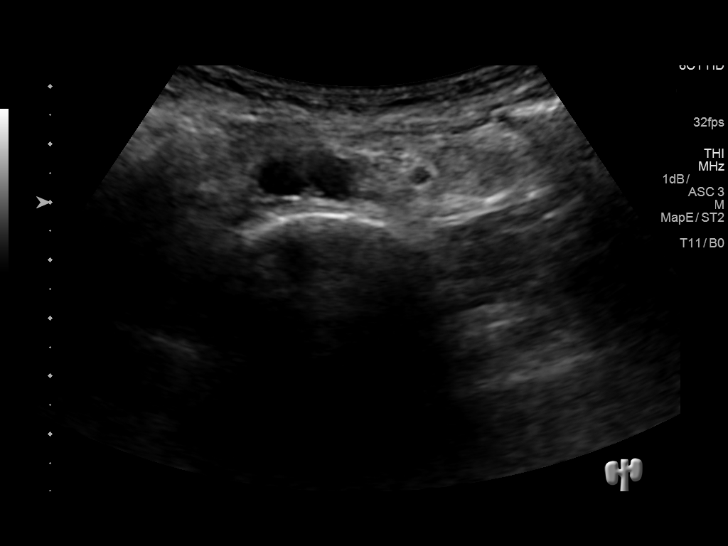

[14 of 25 positions shown; findings below may reference images not displayed]

FINDINGS: Gallbladder: The gallbladder is adequately distended with no
evidence of stones, wall thickening, or pericholecystic fluid. There
is no positive sonographic Murphy's sign.

Common bile duct: Diameter: 3 mm

Liver: No focal lesion identified. Within normal limits in
parenchymal echogenicity.

IVC: No abnormality visualized.

Pancreas: Visualized portion unremarkable.

Spleen: Size and appearance within normal limits.

Right Kidney: Length: 11.0 cm. Echogenicity within normal limits. No
mass or hydronephrosis visualized.

Left Kidney: Length: 10.7 cm. Echogenicity within normal limits. No
mass or hydronephrosis visualized.

Abdominal aorta: No aneurysm visualized.

Other findings: There is no ascites.
IMPRESSION: Normal abdominal ultrasound examination.

## 2017-05-03 ENCOUNTER — Encounter: Payer: Self-pay | Admitting: Family Medicine

## 2017-05-03 ENCOUNTER — Ambulatory Visit: Payer: BLUE CROSS/BLUE SHIELD | Admitting: Family Medicine

## 2017-05-03 VITALS — BP 124/66 | HR 70 | Temp 97.6°F | Ht 67.0 in | Wt 129.0 lb

## 2017-05-03 DIAGNOSIS — R251 Tremor, unspecified: Secondary | ICD-10-CM | POA: Insufficient documentation

## 2017-05-03 DIAGNOSIS — F418 Other specified anxiety disorders: Secondary | ICD-10-CM

## 2017-05-03 LAB — TSH: TSH: 1.03 u[IU]/mL (ref 0.35–4.50)

## 2017-05-03 MED ORDER — HYDROXYZINE HCL 10 MG PO TABS: 10.0000 mg | ORAL_TABLET | Freq: Three times a day (TID) | ORAL | 1 refills | Status: DC | PRN

## 2017-05-03 MED ORDER — SERTRALINE HCL 100 MG PO TABS: 100.0000 mg | ORAL_TABLET | Freq: Every day | ORAL | 11 refills | Status: DC

## 2017-05-03 NOTE — Assessment & Plan Note (Signed)
Strongly suspect due to anxiety (along with sleep issues) Also check TSH today Disc imp of caffeine avoidance

## 2017-05-03 NOTE — Assessment & Plan Note (Addendum)
Since last visit- did better for a while on zoloft (and well tolerated) Symptoms worsened again with stress however  Will inc to 75 and then 100 mg daily  Discussed expectations of SSRI medication including time to effectiveness and mechanism of action, also poss of side effects (early and late)- including mental fuzziness, weight or appetite change, nausea and poss of worse dep or anxiety (even suicidal thoughts)  Pt voiced understanding and will stop med and update if this occurs   Since she did not tolerate xanax- will try hydroxyzine 10-20 mg up to tid prn anx with caution of sedation as well Good insight  Good self care Continue counseling No SI- watching closely for this  Will check TSH today as well in light of tremor  >25 minutes spent in face to face time with patient, >50% spent in counselling or coordination of care-including imp of self care and counseling/ review of physical symptoms from anx dep as well as poss medication side eff  F/u approx 4 wk or sooner if needed

## 2017-05-03 NOTE — Patient Instructions (Addendum)
Go up to 75 mg of sertraline for 2 weeks  (or until you run out of what you have)  and then increase to 100 mg daily   Try the hydroxyzine with caution of sedation  This is in the antihistamine family  Let's check your thyroid today   Follow up in about 4 weeks (earlier if needed)   Take care of yourself

## 2017-05-03 NOTE — Progress Notes (Signed)
Subjective:    Patient ID: Dominique Russo, female    DOB: 1980-10-23, 37 y.o.   MRN: [id number]  HPI  Here for f/u of anxiety/depression symptoms   Wt Readings from Last 3 Encounters:  05/03/17 129 lb (58.5 kg)  03/24/17 133 lb 8 oz (60.6 kg)  02/05/17 140 lb (63.5 kg)   Does not want to eat much   Last visit started sertraline to titrate to 50 mg  Xanax prn   (that made her worse instead of better) She kept taking the zoloft (no side effects)  In counseling   Did better for a while  Had some good days  And then all the sudden her symptoms worsened again  Thinks she is having panic attacks   She is "a wreck" again   Still seeing a therapist  Suggested we try vistaril (hydroxyzine)   She is having trouble sleeping  It is difficult for her to take medication (stigma)   Stressors are ongoing  Ongoing situation - changes every day and she has different triggers  Overwhelmed Panicky  Sobbing at times   Does not feel like she could hurt herself / no SI   She joined an online support group   Trouble falling and staying asleep   Trying to take care of herself physically  Trying to fit in exercise   Suspects this all runs in the family but her family does not believe in mental health tx   Lab Results  Component Value Date   TSH 1.49 06/10/2016    BP Readings from Last 3 Encounters:  05/03/17 124/66  03/24/17 138/80  02/05/17 108/70   Pulse Readings from Last 3 Encounters:  05/03/17 70  03/24/17 86  02/05/17 75    Patient Active Problem List   Diagnosis Date Noted  . Tremor 05/03/2017  . Depression with anxiety 03/24/2017  . Fatigue 06/10/2016  . 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 Right flank pain 01/30/2015  . LGA (large for gestational age) fetus 04/24/2014  . Postpartum care following vaginal delivery (1/12) 04/24/2014  . ARTHRALGIA 11/21/2009  . FIBROCYSTIC BREAST DISEASE 05/18/2007  . PSORIASIS 05/18/2007   Past Medical History:    Diagnosis Date  . Fibrocystic breast   . No pertinent past medical history    Past Surgical History:  Procedure Laterality Date  . NO PAST SURGERIES     Social History   Tobacco Use  . Smoking status: Never Smoker  . Smokeless tobacco: Never Used  Substance Use Topics  . Alcohol use: No    Alcohol/week: 0.0 oz  . Drug use: No   Family History  Problem Relation Age of Onset  . Hypertension Mother   . Breast cancer Mother   . Heart attack Father        x 2, CBG x 2, stent  . Hypertension Father   . Anesthesia problems Neg Hx    Allergies  Allergen Reactions  . Tetanus-Diphtheria Toxoids Td     REACTION: painful local reaction 4/09  . Xanax [Alprazolam]     Made her feel more anxious    Current Outpatient Medications on File Prior to Visit  Medication Sig Dispense Refill  . ALPRAZolam (XANAX) 0.5 MG tablet Take 1 tablet (0.5 mg total) by mouth 2 (two) times daily as needed for anxiety (severe anxiety). Caution of sedation and habit 15 tablet 0   No current facility-administered medications on file prior to visit.     Review of Systems  Constitutional: Positive for appetite change and fever. Negative for activity change, fatigue and unexpected weight change.  HENT: Negative for congestion, ear pain, rhinorrhea, sinus pressure and sore throat.        Pos for throat clearing  Eyes: Negative for pain, redness and visual disturbance.  Respiratory: Negative for cough, shortness of breath and wheezing.   Cardiovascular: Negative for chest pain and palpitations.  Gastrointestinal: Negative for abdominal pain, blood in stool, constipation and diarrhea.  Endocrine: Negative for polydipsia and polyuria.  Genitourinary: Negative for dysuria, frequency and urgency.  Musculoskeletal: Negative for arthralgias, back pain and myalgias.  Skin: Negative for pallor and rash.  Allergic/Immunologic: Negative for environmental allergies.  Neurological: Positive for tremors. Negative for  dizziness, syncope, facial asymmetry, weakness and headaches.  Hematological: Negative for adenopathy. Does not bruise/bleed easily.  Psychiatric/Behavioral: Positive for decreased concentration, dysphoric mood and sleep disturbance. Negative for confusion, self-injury and suicidal ideas. The patient is nervous/anxious.        Objective:   Physical Exam  Constitutional: She appears well-developed and well-nourished. No distress.  Anxious and well appearing   HENT:  Head: Normocephalic and atraumatic.  Mouth/Throat: Oropharynx is clear and moist.  Eyes: Conjunctivae and EOM are normal. Pupils are equal, round, and reactive to light. Right eye exhibits no discharge. Left eye exhibits no discharge. No scleral icterus.  Neck: Normal range of motion. Neck supple. No thyromegaly present.  Cardiovascular: Normal rate, regular rhythm and normal heart sounds.  No murmur heard. Pulmonary/Chest: Effort normal and breath sounds normal. No respiratory distress.  Musculoskeletal: She exhibits no edema.  Lymphadenopathy:    She has no cervical adenopathy.  Neurological: She is alert. She has normal reflexes. She displays tremor. No cranial nerve deficit. She exhibits normal muscle tone. Coordination normal.  Anxious hand tremor noted   Psychiatric: Her speech is normal and behavior is normal. Her mood appears anxious. Her affect is not blunt, not labile and not inappropriate. Thought content is not paranoid. Cognition and memory are normal. She exhibits a depressed mood. She expresses no homicidal and no suicidal ideation.  More anxious than depressed (keyed up and shaky)  Nl speech and thought patterns  Candid about symptoms  Good self awareness No SI          Assessment & Plan:   Problem List Items Addressed This Visit      Other   Depression with anxiety - Primary    Since last visit- did better for a while on zoloft (and well tolerated) Symptoms worsened again with stress however    Will inc to 75 and then 100 mg daily  Discussed expectations of SSRI medication including time to effectiveness and mechanism of action, also poss of side effects (early and late)- including mental fuzziness, weight or appetite change, nausea and poss of worse dep or anxiety (even suicidal thoughts)  Pt voiced understanding and will stop med and update if this occurs   Since she did not tolerate xanax- will try hydroxyzine 10-20 mg up to tid prn anx with caution of sedation as well Good insight  Good self care Continue counseling No SI- watching closely for this  Will check TSH today as well in light of tremor  >25 minutes spent in face to face time with patient, >50% spent in counselling or coordination of care-including imp of self care and counseling/ review of physical symptoms from anx dep as well as poss medication side eff  F/u approx 4 wk or sooner if  needed        Relevant Medications   sertraline (ZOLOFT) 100 MG tablet   hydrOXYzine (ATARAX/VISTARIL) 10 MG tablet   Other Relevant Orders   TSH   Tremor    Strongly suspect due to anxiety (along with sleep issues) Also check TSH today Disc imp of caffeine avoidance       Relevant Orders   TSH

## 2017-06-07 ENCOUNTER — Ambulatory Visit: Payer: BLUE CROSS/BLUE SHIELD | Admitting: Family Medicine

## 2017-06-07 ENCOUNTER — Encounter: Payer: Self-pay | Admitting: Family Medicine

## 2017-06-07 VITALS — BP 94/60 | HR 68 | Temp 97.4°F | Ht 67.0 in | Wt 128.2 lb

## 2017-06-07 DIAGNOSIS — F418 Other specified anxiety disorders: Secondary | ICD-10-CM

## 2017-06-07 DIAGNOSIS — J069 Acute upper respiratory infection, unspecified: Secondary | ICD-10-CM | POA: Insufficient documentation

## 2017-06-07 DIAGNOSIS — B9789 Other viral agents as the cause of diseases classified elsewhere: Secondary | ICD-10-CM | POA: Diagnosis not present

## 2017-06-07 NOTE — Assessment & Plan Note (Signed)
Reassuring exam Supportive care Disc symptomatic care - see instructions on AVS  Saline/zinc lozenge/rest/otc analgesics  Update if not starting to improve in a week or if worsening

## 2017-06-07 NOTE — Progress Notes (Signed)
Subjective:    Patient ID: Dominique Russo, female    DOB: 09/25/1980, 37 y.o.   MRN: [id number]  HPI Here for f/u of anxiety and depression   ST last night  L ear hurt Better this am  Whole family has been sick   Wt Readings from Last 3 Encounters:  06/07/17 128 lb 4 oz (58.2 kg)  05/03/17 129 lb (58.5 kg)  03/24/17 133 lb 8 oz (60.6 kg)   20.09 kg/m   Last visit we inc her sertraline to 100 mg  Also px hydroxyzine for anxiety prn (has only needed it a few times) - it may have helped her fall asleep   Made a lot of progress  Calmer and more rational than in years   Sleeping would be better if her kids did not interrupt her  Appetite is still fairly low   Less depressed and anxious  No longer fixating on things  Is able to concentrate better  No longer in an utter panic  Husband notices   In counseling- very helpful   Self care- not a lot - but trying  She is going to [place] on Sunday with her sister - first vacation in 10 years! Husband will take care of everything here  This is spontaneous    Patient Active Problem List   Diagnosis Date Noted  . Viral URI with cough 06/07/2017  . Tremor 05/03/2017  . Depression with anxiety 03/24/2017  . Fatigue 06/10/2016  . 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 Right flank pain 01/30/2015  . LGA (large for gestational age) fetus 04/24/2014  . Postpartum care following vaginal delivery (1/12) 04/24/2014  . ARTHRALGIA 11/21/2009  . FIBROCYSTIC BREAST DISEASE 05/18/2007  . PSORIASIS 05/18/2007   Past Medical History:  Diagnosis Date  . Fibrocystic breast   . No pertinent past medical history    Past Surgical History:  Procedure Laterality Date  . NO PAST SURGERIES     Social History   Tobacco Use  . Smoking status: Never Smoker  . Smokeless tobacco: Never Used  Substance Use Topics  . Alcohol use: No    Alcohol/week: 0.0 oz  . Drug use: No   Family History  Problem Relation Age of Onset  .  Hypertension Mother   . Breast cancer Mother   . Heart attack Father        x 2, CBG x 2, stent  . Hypertension Father   . Anesthesia problems Neg Hx    Allergies  Allergen Reactions  . Tetanus-Diphtheria Toxoids Td     REACTION: painful local reaction 4/09  . Xanax [Alprazolam]     Made her feel more anxious    Current Outpatient Medications on File Prior to Visit  Medication Sig Dispense Refill  . hydrOXYzine (ATARAX/VISTARIL) 10 MG tablet Take 1-2 tablets (10-20 mg total) by mouth 3 (three) times daily as needed for anxiety. With caution of sedation 30 tablet 1  . sertraline (ZOLOFT) 100 MG tablet Take 1 tablet (100 mg total) by mouth daily. 30 tablet 11   No current facility-administered medications on file prior to visit.     Review of Systems  Constitutional: Negative for activity change, appetite change, fatigue, fever and unexpected weight change.  HENT: Negative for congestion, ear pain, rhinorrhea, sinus pressure and sore throat.   Eyes: Negative for pain, redness and visual disturbance.  Respiratory: Negative for cough, shortness of breath and wheezing.   Cardiovascular: Negative for chest pain and palpitations.  Gastrointestinal: Negative for abdominal pain, blood in stool, constipation and diarrhea.  Endocrine: Negative for polydipsia and polyuria.  Genitourinary: Negative for dysuria, frequency and urgency.  Musculoskeletal: Negative for arthralgias, back pain and myalgias.  Skin: Negative for pallor and rash.  Allergic/Immunologic: Negative for environmental allergies.  Neurological: Negative for dizziness, syncope and headaches.  Hematological: Negative for adenopathy. Does not bruise/bleed easily.  Psychiatric/Behavioral: Negative for decreased concentration, dysphoric mood, self-injury and suicidal ideas. The patient is nervous/anxious.        Mood is much improved       Objective:   Physical Exam  Constitutional: She appears well-developed and  well-nourished. No distress.  Well appearing   HENT:  Head: Normocephalic and atraumatic.  Right Ear: External ear normal.  Left Ear: External ear normal.  Mouth/Throat: Oropharynx is clear and moist.  Nares are injected and congested  No sinus tenderness Clear rhinorrhea and post nasal drip   Eyes: Conjunctivae and EOM are normal. Pupils are equal, round, and reactive to light. Right eye exhibits no discharge. Left eye exhibits no discharge.  Neck: Normal range of motion. Neck supple.  Cardiovascular: Normal rate, regular rhythm and normal heart sounds.  Pulmonary/Chest: Effort normal and breath sounds normal. No respiratory distress. She has no wheezes. She has no rales. She exhibits no tenderness.  Good air exch No rales or rhonchi  Lymphadenopathy:    She has no cervical adenopathy.  Neurological: She is alert.  Skin: Skin is warm and dry. No rash noted. No pallor.  Psychiatric: She has a normal mood and affect.          Assessment & Plan:   Problem List Items Addressed This Visit      Respiratory   Viral URI with cough    Reassuring exam Supportive care Disc symptomatic care - see instructions on AVS  Saline/zinc lozenge/rest/otc analgesics  Update if not starting to improve in a week or if worsening          Other   Depression with anxiety - Primary    Much improved with inc of sertraline to 100 mg in both dep and anx symptoms  Reviewed stressors/ coping techniques/symptoms/ support sources/ tx options and side effects in detail today  Planning a trip to [place] next week !   (first vaca in 2010 y)  Starting to take care of herself  Will stay at current dose  Continue counseling (and work on stressors)  F/u 6 mo or earlier if needed

## 2017-06-07 NOTE — Patient Instructions (Addendum)
Keep going to counseling  Think about meditation - practice helps   Continue current medication dose   For cold symptoms - drink lots of fluids/rest  Try nasal saline  Breathe steam  Zinc lozenges for the first few days   Update if not starting to improve in a week-or two  or if worsening    Follow up in 6 months -earlier if needed

## 2017-06-07 NOTE — Assessment & Plan Note (Signed)
Much improved with inc of sertraline to 100 mg in both dep and anx symptoms  Reviewed stressors/ coping techniques/symptoms/ support sources/ tx options and side effects in detail today  Planning a trip to [place] next week !   (first vaca in 9110 y)  Starting to take care of herself  Will stay at current dose  Continue counseling (and work on stressors)  F/u 6 mo or earlier if needed

## 2018-02-04 ENCOUNTER — Ambulatory Visit: Payer: BLUE CROSS/BLUE SHIELD | Admitting: Family Medicine

## 2018-06-01 ENCOUNTER — Other Ambulatory Visit: Payer: Self-pay | Admitting: Family Medicine

## 2018-06-01 NOTE — Telephone Encounter (Signed)
Please schedule a spring f/u and refill until then 

## 2018-06-01 NOTE — Telephone Encounter (Signed)
Pt hasn't been seen in almost a year and no future appts

## 2018-06-02 NOTE — Telephone Encounter (Signed)
I left a message on patient's voice mail to return my call.  Patient needs to schedule spring f/u appointment with Dr.Tower.

## 2018-06-02 NOTE — Telephone Encounter (Signed)
Pt scheduled appt with Lyla Son so med refilled until her appt

## 2018-08-31 ENCOUNTER — Telehealth: Payer: Self-pay | Admitting: Family Medicine

## 2018-08-31 NOTE — Telephone Encounter (Signed)
I left a message on patient's voice mail to return my call.  Please change patient's 09/09/18 follow up to [email or web address].

## 2018-09-09 ENCOUNTER — Encounter: Payer: Self-pay | Admitting: Family Medicine

## 2018-09-09 ENCOUNTER — Ambulatory Visit (INDEPENDENT_AMBULATORY_CARE_PROVIDER_SITE_OTHER): Payer: BLUE CROSS/BLUE SHIELD | Admitting: Family Medicine

## 2018-09-09 DIAGNOSIS — F418 Other specified anxiety disorders: Secondary | ICD-10-CM

## 2018-09-09 MED ORDER — SERTRALINE HCL 100 MG PO TABS: 100.0000 mg | ORAL_TABLET | Freq: Every day | ORAL | 3 refills | Status: DC

## 2018-09-09 NOTE — Patient Instructions (Signed)
Continue your sertraline at current dose  Let's continue this while the pandemic stress is high and think about weaning later at a less stressful time Try to practice self care (keep getting outdoors) Eat regular meals and keep regular sleep habits  Let us know if you need anything

## 2018-09-09 NOTE — Assessment & Plan Note (Signed)
Overall doing quite well on current tx (sertraine 100) especially given the pandemic worries and stressors and long work hours  Reviewed stressors/ coping techniques/symptoms/ support sources/ tx options and side effects in detail today  She would like to eventually wean it-but will hold off for now until situational stress dies down Has not needed hydroxyzine lately but has it if she does Refilled sertraline PHQ score of 2 - reassuring Self care if fair -long work hours make it difficult, but she is outdoors and getting physical activity  Will continue to follow

## 2018-09-09 NOTE — Progress Notes (Signed)
Virtual Visit via Video Note  I connected with Dominique Russo on 09/09/18 at  9:00 AM EDT by a video enabled telemedicine application and verified that I am speaking with the correct person using two identifiers.  Location: Patient: home Provider: office    I discussed the limitations of evaluation and management by telemedicine and the availability of in person appointments. The patient expressed understanding and agreed to proceed.  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) No time for self care  Weight -no changes   Eating well   Sleep is ok - 7 hours per night generally  Her 38 yo wakes her up   Depression with anxiety  Taking sertraline 100 mg daily  Has helped a lot- prevents freaking out    hydroxyzine 10-20 mg prn anxiety  Hasn't really needed it   PHQ Score of 2  Depression screen [facility] 2/9 09/09/2018 03/24/2017  Decreased Interest 0 3  Down, Depressed, Hopeless - 3  PHQ - 2 Score 0 6  Altered sleeping 1 3  Tired, decreased energy 1 3  Change in appetite 0 3  Feeling bad or failure about yourself  0 3  Trouble concentrating 0 3  Moving slowly or fidgety/restless 0 3  Suicidal thoughts 0 3  PHQ-9 Score 2 27     Is outdoors a lot at work  Exercise as well  A little less time with family   Gyn care-? Last / after son was born   Tetanus shot 09 - bad local reaction   Review of Systems  Constitutional: Positive for malaise/fatigue. Negative for chills, fever and weight loss.       Fatigue from schedule Not malaise  Eyes: Negative for blurred vision.  Respiratory: Negative for cough and shortness of breath.   Cardiovascular: Negative for chest pain and palpitations.  Gastrointestinal: Negative for abdominal pain.  Musculoskeletal: Negative for  myalgias.  Skin: Negative for rash.  Neurological: Negative for dizziness and headaches.  Psychiatric/Behavioral: Negative for depression, memory loss and suicidal ideas. The patient is nervous/anxious. The patient does not have insomnia.     Patient Active Problem List   Diagnosis Date Noted  . Tremor 05/03/2017  . Depression with anxiety 03/24/2017  . Fatigue 06/10/2016  . 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 Right flank pain 01/30/2015  . LGA (large for gestational age) fetus 04/24/2014  . Postpartum care following vaginal delivery (1/12) 04/24/2014  . ARTHRALGIA 11/21/2009  . FIBROCYSTIC BREAST DISEASE 05/18/2007  . PSORIASIS 05/18/2007   Past Medical History:  Diagnosis Date  . Fibrocystic breast   . No pertinent past medical history    Past Surgical History:  Procedure Laterality Date  . NO PAST SURGERIES     Social History   Tobacco Use  . Smoking status: Never Smoker  . Smokeless tobacco: Never Used  Substance Use Topics  . Alcohol use: No    Alcohol/week: 0.0 standard drinks  . Drug use: No   Family History  Problem Relation Age of Onset  . Hypertension Mother   . Breast cancer Mother   . Heart attack Father        x 2, CBG x 2, stent  . Hypertension Father   . Anesthesia problems Neg Hx    Allergies  Allergen Reactions  . Tetanus-Diphtheria Toxoids Td     REACTION: painful local reaction 4/09  . Xanax [Alprazolam]     Made her feel more anxious    Current Outpatient Medications on File Prior to Visit  Medication Sig Dispense Refill  . hydrOXYzine (ATARAX/VISTARIL) 10 MG tablet Take 1-2 tablets (10-20 mg total) by mouth 3 (three) times daily as needed for anxiety. With caution of sedation 30 tablet 1   No current facility-administered medications on file prior to visit.     Observations/Objective: Patient appears well, in no distress Weight is baseline  No facial swelling or asymmetry Normal voice-not hoarse and no slurred speech No obvious  tremor or mobility impairment Moving neck and UEs normally Able to hear the call well  No cough or shortness of breath during interview  Talkative and mentally sharp with no cognitive changes No skin changes on face or neck , no rash or pallor Affect is very slightly anxious (if at all) , generally mood seems good and she is talkative    Assessment and Plan: Problem List Items Addressed This Visit      Other   Depression with anxiety - Primary    Overall doing quite well on current tx (sertraine 100) especially given the pandemic worries and stressors and long work hours  Reviewed stressors/ coping techniques/symptoms/ support sources/ tx options and side effects in detail today  She would like to eventually wean it-but will hold off for now until situational stress dies down Has not needed hydroxyzine lately but has it if she does Refilled sertraline PHQ score of 2 - reassuring Self care if fair -long work hours make it difficult, but she is outdoors and getting physical activity  Will continue to follow       Relevant Medications   sertraline (ZOLOFT) 100 MG tablet       Follow Up Instructions: Continue your sertraline at current dose  Let's continue this while the pandemic stress is high and think about weaning later at a less stressful time Try to practice self care (keep getting outdoors) Eat regular meals and keep regular sleep habits  Let us know if you need anything     I discussed the assessment and treatment plan with the patient. The patient was provided an opportunity to ask questions and all were answered. The patient agreed with the plan and demonstrated an understanding of the instructions.   The patient was advised to call back or seek an in-person evaluation if the symptoms worsen or if the condition fails to improve as anticipated.     Roxy MannsMarne Graiden Henes, MD

## 2018-12-20 ENCOUNTER — Other Ambulatory Visit: Payer: Self-pay

## 2018-12-20 DIAGNOSIS — Z20822 Contact with and (suspected) exposure to covid-19: Secondary | ICD-10-CM

## 2018-12-22 LAB — NOVEL CORONAVIRUS, NAA: SARS-CoV-2, NAA: NOT DETECTED

## 2019-01-02 ENCOUNTER — Ambulatory Visit: Payer: BLUE CROSS/BLUE SHIELD | Admitting: Family Medicine

## 2019-02-01 ENCOUNTER — Other Ambulatory Visit: Payer: Self-pay

## 2019-02-01 DIAGNOSIS — Z20822 Contact with and (suspected) exposure to covid-19: Secondary | ICD-10-CM

## 2019-02-03 LAB — NOVEL CORONAVIRUS, NAA: SARS-CoV-2, NAA: NOT DETECTED

## 2019-03-20 ENCOUNTER — Encounter: Payer: Self-pay | Admitting: Family Medicine

## 2019-03-20 ENCOUNTER — Ambulatory Visit: Payer: BC Managed Care – PPO | Admitting: Family Medicine

## 2019-03-20 ENCOUNTER — Other Ambulatory Visit: Payer: Self-pay

## 2019-03-20 ENCOUNTER — Ambulatory Visit (INDEPENDENT_AMBULATORY_CARE_PROVIDER_SITE_OTHER)
Admission: RE | Admit: 2019-03-20 | Discharge: 2019-03-20 | Disposition: A | Discharge status: Home / Self Care | Payer: BC Managed Care – PPO | Source: Ambulatory Visit | Attending: Family Medicine | Admitting: Family Medicine

## 2019-03-20 VITALS — BP 130/78 | HR 73 | Temp 98.1°F | Ht 67.0 in | Wt 145.4 lb

## 2019-03-20 DIAGNOSIS — R0989 Other specified symptoms and signs involving the circulatory and respiratory systems: Secondary | ICD-10-CM | POA: Diagnosis not present

## 2019-03-20 DIAGNOSIS — R198 Other specified symptoms and signs involving the digestive system and abdomen: Secondary | ICD-10-CM

## 2019-03-20 DIAGNOSIS — R0789 Other chest pain: Secondary | ICD-10-CM

## 2019-03-20 MED ORDER — OMEPRAZOLE 20 MG PO CPDR: 20.0000 mg | DELAYED_RELEASE_CAPSULE | Freq: Two times a day (BID) | ORAL | 1 refills | Status: DC

## 2019-03-20 NOTE — Assessment & Plan Note (Signed)
Disc poss of pnd vs GERD Also some chest discomfort and epigastric tenderness  Trial of omeprzole 20 mg bid  Also CXR Update after a week-if no imp consider ENT eval

## 2019-03-20 NOTE — Patient Instructions (Addendum)
I think your symptoms may certainly be from acid reflux  Take px omeprazole twice daily and let me know in a week how symptoms are  However - if symptoms worsen at any time do let us know   Also chest xray and EKG today  We will get back to with results   Watch diet for caffeine /spicy food/acidic things like tomato sauce

## 2019-03-20 NOTE — Assessment & Plan Note (Signed)
Given also a globus sensation- GERD is in the differential  Re assuring EKG Some epigastric tenderness cxr today-pend rad review Trial of omeprazole 20 mg bid (also watch diet)  Update if not starting to improve in a week or if worsening

## 2019-03-20 NOTE — Progress Notes (Signed)
Subjective:    Patient ID: Dominique Russo, female    DOB: [birth year], 38 y.o.   MRN: [id number]  HPI Pt presents with possible  symptoms of GERD  Just over a week ago- developed discomfort in her chest - feels like there is something in her throat  (constantly) Then she gets panicky  Chest is sore (knawing pain)  Friend suggested possible GERD  If she yawns - relief for only a second  Constant all day   Does not hurt to swallow  Can eat ok  Can talk ok   As a rule- not prone to heartburn or indigestion  occ a little nauseated No vomiting  Decreased appetite No stool habit changes   No pnd  No congestion No sneezing  No cough  She does clear her throat a lot   No change in her diet  No lifestyle change   Always under stress  Especially with the holidays     She took an antacid -no help Took 1 prevacid ac yesterday and this am  Also alka selzer -no help   Xanax also did not help   Tried claritin also-no help   Father had MI in his 32s and has had 2 strokes   EKG today : NSR with rate of 69 and no acute changes  Patient Active Problem List   Diagnosis Date Noted  . Globus sensation 03/20/2019  . Chest discomfort 03/20/2019  . Tremor 05/03/2017  . Depression with anxiety 03/24/2017  . Fatigue 06/10/2016  . 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 Right flank pain 01/30/2015  . LGA (large for gestational age) fetus 04/24/2014  . Postpartum care following vaginal delivery (1/12) 04/24/2014  . ARTHRALGIA 11/21/2009  . FIBROCYSTIC BREAST DISEASE 05/18/2007  . PSORIASIS 05/18/2007   Past Medical History:  Diagnosis Date  . Fibrocystic breast   . No pertinent past medical history    Past Surgical History:  Procedure Laterality Date  . NO PAST SURGERIES     Social History   Tobacco Use  . Smoking status: Never Smoker  . Smokeless tobacco: Never Used  Substance Use Topics  . Alcohol use: No    Alcohol/week: 0.0 standard drinks  . Drug  use: No   Family History  Problem Relation Age of Onset  . Hypertension Mother   . Breast cancer Mother   . Heart attack Father        x 2, CBG x 2, stent  . Hypertension Father   . Anesthesia problems Neg Hx    Allergies  Allergen Reactions  . Tetanus-Diphtheria Toxoids Td     REACTION: painful local reaction 4/09  . Xanax [Alprazolam]     Made her feel more anxious    Current Outpatient Medications on File Prior to Visit  Medication Sig Dispense Refill  . hydrOXYzine (ATARAX/VISTARIL) 10 MG tablet Take 1-2 tablets (10-20 mg total) by mouth 3 (three) times daily as needed for anxiety. With caution of sedation 30 tablet 1  . sertraline (ZOLOFT) 100 MG tablet Take 1 tablet (100 mg total) by mouth daily. 90 tablet 3   No current facility-administered medications on file prior to visit.      Review of Systems  Constitutional: Negative for activity change, appetite change, fatigue, fever and unexpected weight change.  HENT: Positive for postnasal drip and trouble swallowing. Negative for congestion, ear pain, rhinorrhea, sinus pressure, sinus pain and sore throat.        Throat clearing  Eyes:  Negative for pain, redness and visual disturbance.  Respiratory: Negative for cough, shortness of breath and wheezing.   Cardiovascular: Negative for chest pain and palpitations.  Gastrointestinal: Positive for nausea. Negative for abdominal distention, abdominal pain, anal bleeding, blood in stool, constipation, diarrhea, rectal pain and vomiting.  Endocrine: Negative for polydipsia and polyuria.  Genitourinary: Negative for dysuria, frequency and urgency.  Musculoskeletal: Negative for arthralgias, back pain and myalgias.  Skin: Negative for pallor and rash.  Allergic/Immunologic: Negative for environmental allergies.  Neurological: Negative for dizziness, syncope and headaches.  Hematological: Negative for adenopathy. Does not bruise/bleed easily.  Psychiatric/Behavioral: Negative for  decreased concentration and dysphoric mood. The patient is not nervous/anxious.        Objective:   Physical Exam Constitutional:      General: She is not in acute distress.    Appearance: Normal appearance. She is well-developed and normal weight. She is not ill-appearing.  HENT:     Head: Normocephalic and atraumatic.     Right Ear: Tympanic membrane, ear canal and external ear normal.     Left Ear: Tympanic membrane and ear canal normal.     Nose: Nose normal.     Mouth/Throat:     Mouth: Mucous membranes are moist.     Pharynx: Oropharynx is clear. No oropharyngeal exudate or posterior oropharyngeal erythema.     Comments: Scant clear pnd Eyes:     General: No scleral icterus.       Right eye: No discharge.        Left eye: No discharge.     Conjunctiva/sclera: Conjunctivae normal.     Pupils: Pupils are equal, round, and reactive to light.  Neck:     Musculoskeletal: Normal range of motion and neck supple. No neck rigidity or muscular tenderness.     Thyroid: No thyromegaly.     Vascular: No carotid bruit or JVD.  Cardiovascular:     Rate and Rhythm: Normal rate and regular rhythm.     Heart sounds: Normal heart sounds. No gallop.   Pulmonary:     Effort: Pulmonary effort is normal. No respiratory distress.     Breath sounds: Normal breath sounds. No stridor. No wheezing or rales.     Comments: Good air exch Abdominal:     General: Abdomen is flat. Bowel sounds are normal. There is no distension or abdominal bruit.     Palpations: Abdomen is soft. There is no hepatomegaly, splenomegaly, mass or pulsatile mass.     Tenderness: There is abdominal tenderness in the epigastric area. There is no guarding or rebound. Negative signs include Murphy's sign and McBurney's sign.     Comments: Very mild epigastric tenderness  Lymphadenopathy:     Cervical: No cervical adenopathy.  Skin:    General: Skin is warm and dry.     Findings: No rash.  Neurological:     Mental Status:  She is alert.     Deep Tendon Reflexes: Reflexes are normal and symmetric.           Assessment & Plan:   Problem List Items Addressed This Visit      Other   Globus sensation    Disc poss of pnd vs GERD Also some chest discomfort and epigastric tenderness  Trial of omeprzole 20 mg bid  Also CXR Update after a week-if no imp consider ENT eval      Relevant Orders   DG Chest 2 View   Chest discomfort - Primary  Given also a globus sensation- GERD is in the differential  Re assuring EKG Some epigastric tenderness cxr today-pend rad review Trial of omeprazole 20 mg bid (also watch diet)  Update if not starting to improve in a week or if worsening        Relevant Orders   DG Chest 2 View   EKG 12-Lead (Completed)

## 2019-09-06 ENCOUNTER — Other Ambulatory Visit: Payer: Self-pay | Admitting: Family Medicine

## 2019-09-06 NOTE — Telephone Encounter (Signed)
Please schedule a mid to late summer f/u and refill until then

## 2019-09-06 NOTE — Telephone Encounter (Signed)
Last OV was 03/20/19 to discuss GERD, no recent or future f/u or CPE appts., please advise

## 2019-09-06 NOTE — Telephone Encounter (Signed)
Med refilled once and Lyla Son will reach out to schedule appt

## 2019-09-07 ENCOUNTER — Encounter: Payer: Self-pay | Admitting: Family Medicine

## 2019-09-07 NOTE — Telephone Encounter (Signed)
I attempted to call patient with no answer and her mailbox is full. I'll ask [place] to send patient a letter.

## 2019-09-07 NOTE — Telephone Encounter (Signed)
I attempted to call patient, but her mailbox is full. I'll try again later.

## 2019-09-08 ENCOUNTER — Encounter: Payer: Self-pay | Admitting: Family Medicine

## 2019-09-08 ENCOUNTER — Encounter: Payer: Self-pay | Admitting: *Deleted

## 2019-12-01 ENCOUNTER — Other Ambulatory Visit: Payer: Self-pay | Admitting: Family Medicine

## 2020-01-03 IMAGING — DX DG CHEST 2V
2 series · 2 of 2 positions shown · non-contrast
Comparison: None.

CLINICAL DATA: Mid chest discomfort.  Globus sensation.

EXAM:
CHEST - 2 VIEW

[chest pa]
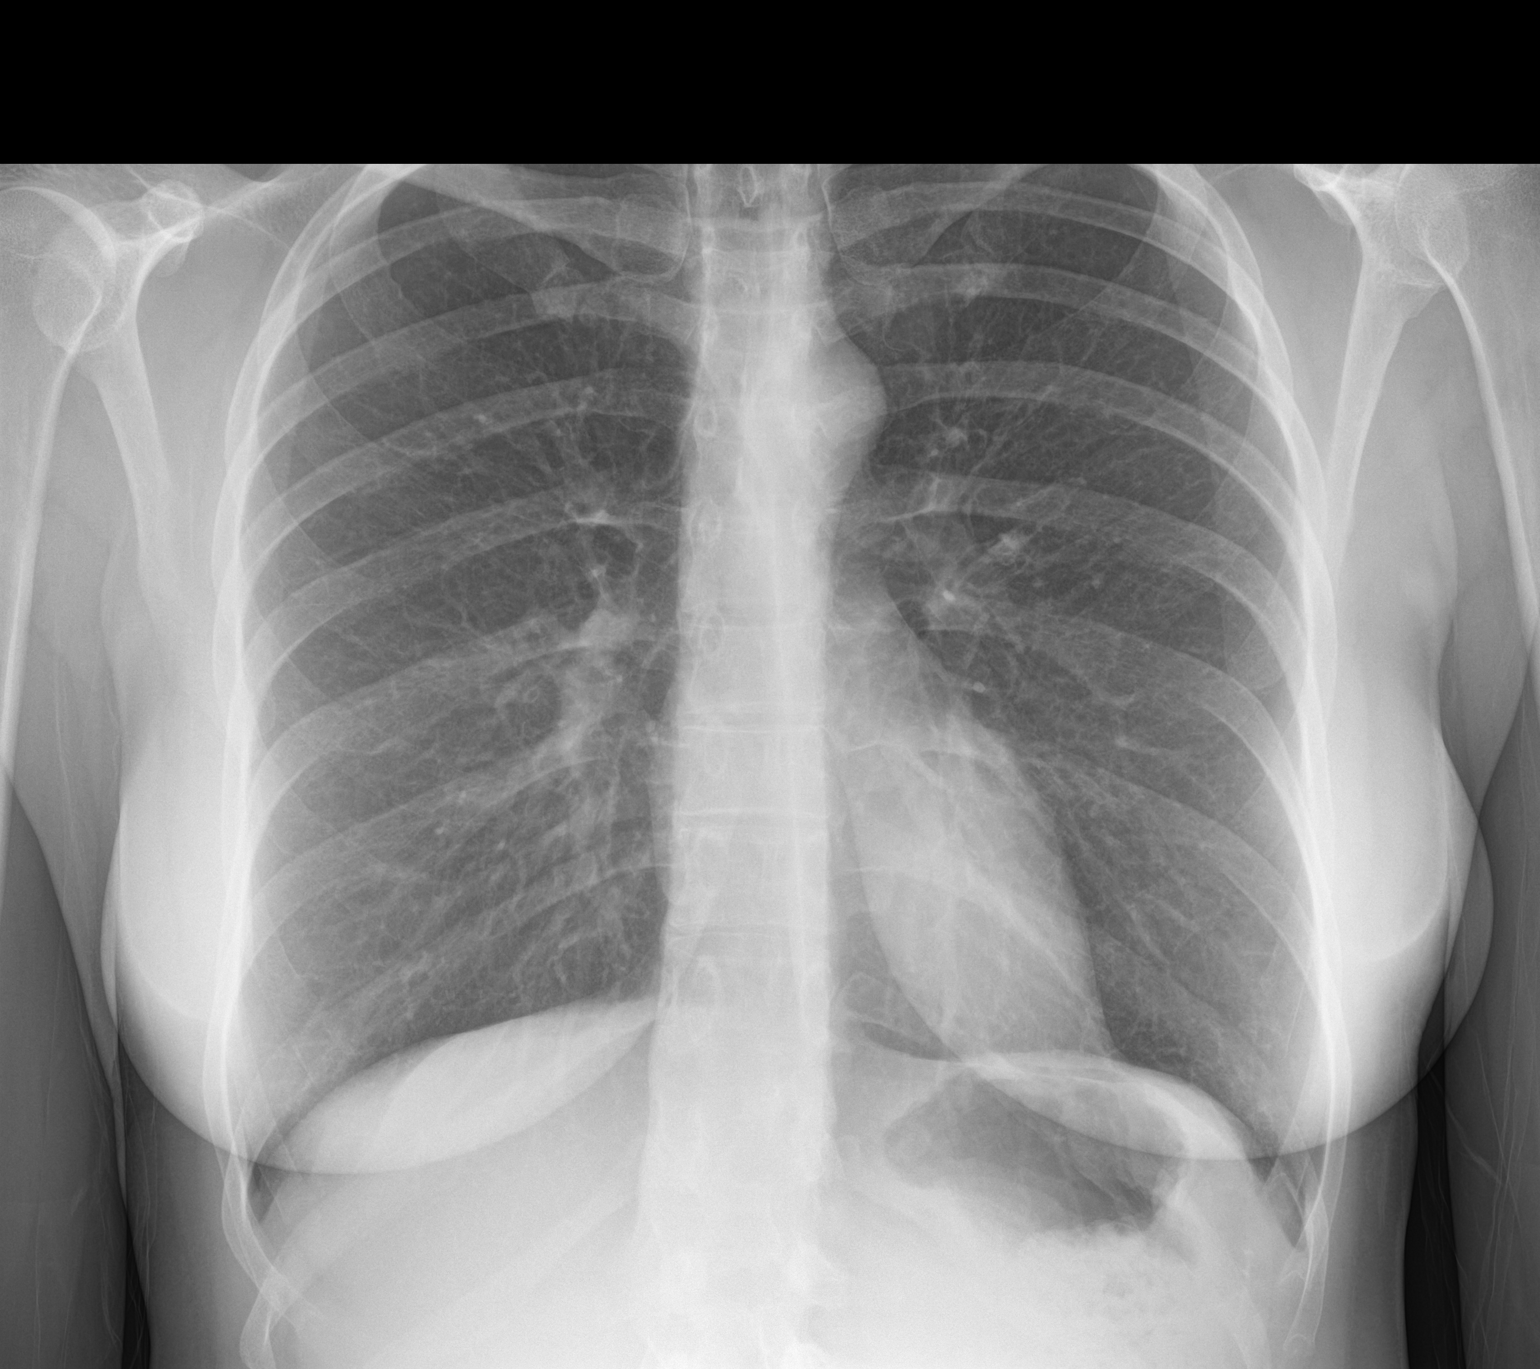

[chest lat]
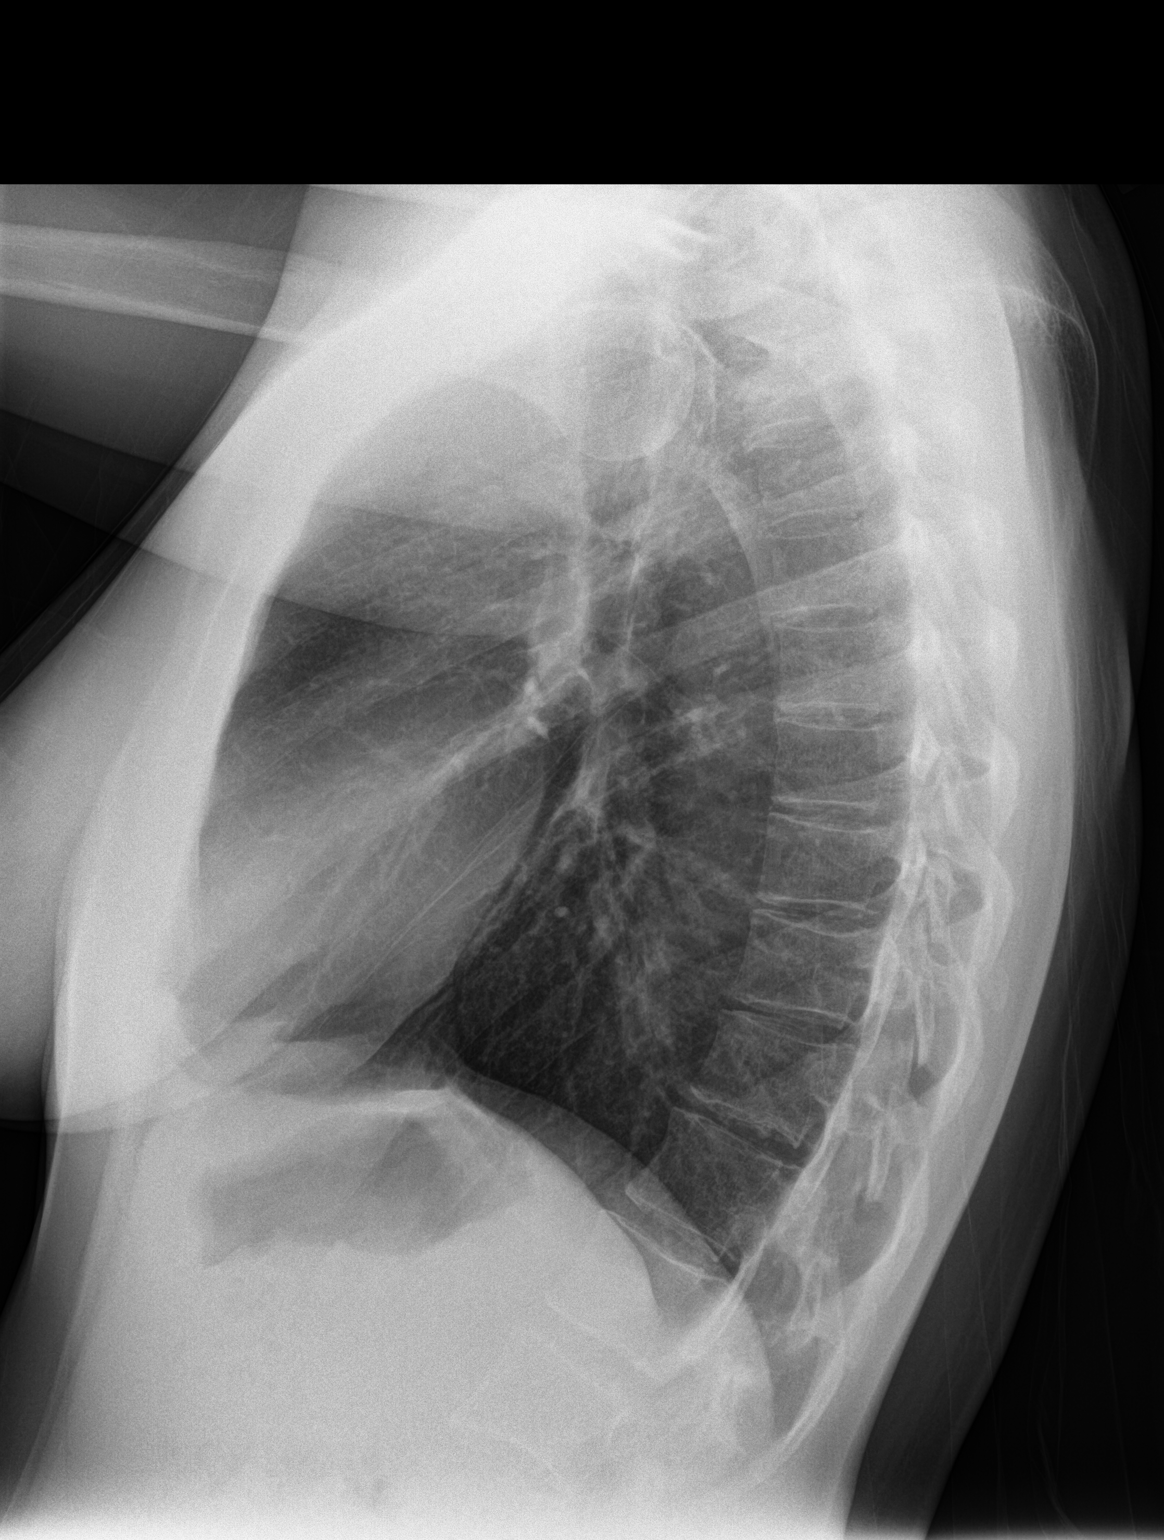

[2 of 2 positions shown; findings below may reference images not displayed]

FINDINGS: The heart size and mediastinal contours are within normal limits.
Both lungs are clear. The visualized skeletal structures are
unremarkable.
IMPRESSION: Normal exam.

## 2020-06-17 ENCOUNTER — Encounter: Payer: Self-pay | Admitting: Family Medicine

## 2020-06-17 ENCOUNTER — Other Ambulatory Visit: Payer: Self-pay

## 2020-06-17 ENCOUNTER — Ambulatory Visit (INDEPENDENT_AMBULATORY_CARE_PROVIDER_SITE_OTHER): Payer: BLUE CROSS/BLUE SHIELD | Admitting: Family Medicine

## 2020-06-17 VITALS — BP 90/70 | HR 72 | Temp 98.7°F | Ht 65.5 in | Wt 138.5 lb

## 2020-06-17 DIAGNOSIS — M79644 Pain in right finger(s): Secondary | ICD-10-CM | POA: Diagnosis not present

## 2020-06-17 DIAGNOSIS — S4351XA Sprain of right acromioclavicular joint, initial encounter: Secondary | ICD-10-CM | POA: Diagnosis not present

## 2020-06-17 NOTE — Progress Notes (Signed)
Dominique Russo T. Sukari Grist, MD, CAQ Sports Medicine  Primary Care and Sports Medicine [facility] [address] [place], [address]  Phone: [phone]  FAX: [phone]  [name] - 40 y.o. female  MRN [id number]  Date of Birth: 24-Jun-1980  Date: 06/17/2020  PCP: Judy Pimple, MD  Referral: Judy Pimple, MD  Chief Complaint  Patient presents with  . Shoulder Pain    Right-Fell off sled in [place]  . Hand Pain    Right    This visit occurred during the SARS-CoV-2 public health emergency.  Safety protocols were in place, including screening questions prior to the visit, additional usage of staff PPE, and extensive cleaning of exam room while observing appropriate contact time as indicated for disinfecting solutions.   Subjective:   Dominique Russo is a 40 y.o. very pleasant female patient with Body mass index is 22.7 kg/m. who presents with the following:  Acute shoulder injury status post sledding.  Hand also and some ? If related. Finger pain that started around the same time.  Unclear injury.  Stood up on a sled - fell off.  She was trying to ride the sled while standing like a snowboard.  She fell off of the sled and landed on the point of her right shoulder. Hurt bad even at first.  Will catch and will putting on her shoulder. She has had a pain in a T-shirt distribution she has had a pain reaching across her body with the right shoulder.  She has no known history of significant shoulder trauma in the past, fractures, or operative intervention. This is not been getting significantly improved over time.  She has taken some ibuprofen for a few days.  Review of Systems is noted in the HPI, as appropriate   Objective:   BP 90/70   Pulse 72   Temp 98.7 F (37.1 C) (Temporal)   Ht 5' 5.5" (1.664 m)   Wt 138 lb 8 oz (62.8 kg)   LMP 05/29/2020 (Approximate)   SpO2 98%   BMI 22.70 kg/m    Shoulder:  R Inspection: No muscle wasting or winging Ecchymosis/edema: neg  AC joint, scapula, clavicle: She does have some tenderness at the [place] joint.  There is not an obvious elevation of the distal clavicle or the [facility] joint. Cervical spine: NT, full ROM Spurling's: neg Abduction: full, 5/5 Flexion: full, 5/5 IR, full, lift-off: 5/5 ER at neutral: full, 5/5 AC crossover and compression: Positive.  On the right.   Neer: neg Hawkins: neg Drop Test: neg Empty Can: neg Supraspinatus insertion: NT Bicipital groove: mildly ttp Speed's: Speed's does provoke some mild pain Yergason's: neg Sulcus sign: neg Scapular dyskinesis: none C5-T1 intact Sensation intact Grip 5/5   R hand Ecchymosis or edema: neg ROM wrist/hand/digits: full  Carpals, MCP's, digits: NT Distal Ulna and Radius: NT Ecchymosis or edema: neg No instability Cysts/nodules: neg Digit triggering: neg Finkelstein's test: neg Snuffbox tenderness: neg Scaphoid tubercle: NT Resisted supination: NT Full composite fist, no malrotation Grip, all digits: 5/5 str DIPJT: NT PIP JT: NT MCP JT: NT No tenosynovitis Axial load test: neg Atrophy: neg  Hand sensation: intact   Radiology: No results found.  Assessment and Plan:     ICD-10-CM   1. Sprain of acromioclavicular joint, right, initial encounter  S43.51XA   2. Pain of right middle finger  M79.644    Classic injury for [facility] joint sprain/separation.  Grade 1.  Anticipate that she is going to do well, continue with basic motion and activity.  No concern for rotator cuff pathology.  I think ice after activity is also appropriate.  I think doing some scheduled anti-inflammatories would also be helpful as a first step.  If her symptoms persist, then a short burst of some steroids would probably be appropriate as well.  Her hand exam is normal.  By history, she may have irritated a metacarpal nerve.  I anticipate that she would do fine.  Patient Instructions  Motrin 600 -  800 mg recommended three times a day. (Over the counter Motrin, Advil, or Generic Ibuprofen 200 mg tablets. 3-4 tablets by mouth 3 times a day. This equals a prescription strength dose.)    Medications Discontinued During This Encounter  Medication Reason  . hydrOXYzine (ATARAX/VISTARIL) 10 MG tablet Completed Course  . omeprazole (PRILOSEC) 20 MG capsule Completed Course  . sertraline (ZOLOFT) 100 MG tablet Completed Course   Follow-up: prn  Signed,  Bronda Alfred T. Gatha Mcnulty, MD   Outpatient Encounter Medications as of 06/17/2020  Medication Sig  . [DISCONTINUED] hydrOXYzine (ATARAX/VISTARIL) 10 MG tablet Take 1-2 tablets (10-20 mg total) by mouth 3 (three) times daily as needed for anxiety. With caution of sedation  . [DISCONTINUED] omeprazole (PRILOSEC) 20 MG capsule Take 1 capsule (20 mg total) by mouth 2 (two) times daily before a meal.  . [DISCONTINUED] sertraline (ZOLOFT) 100 MG tablet TAKE 1 TABLET BY MOUTH ONCE DAILY   No facility-administered encounter medications on file as of 06/17/2020.

## 2020-06-17 NOTE — Patient Instructions (Signed)
Motrin 600 - 800 mg recommended three times a day. (Over the counter Motrin, Advil, or Generic Ibuprofen 200 mg tablets. 3-4 tablets by mouth 3 times a day. This equals a prescription strength dose.)    

## 2020-11-05 ENCOUNTER — Telehealth (INDEPENDENT_AMBULATORY_CARE_PROVIDER_SITE_OTHER): Payer: BLUE CROSS/BLUE SHIELD | Admitting: Family Medicine

## 2020-11-05 ENCOUNTER — Encounter: Payer: Self-pay | Admitting: Family Medicine

## 2020-11-05 VITALS — Wt 140.0 lb

## 2020-11-05 DIAGNOSIS — J0140 Acute pansinusitis, unspecified: Secondary | ICD-10-CM

## 2020-11-05 MED ORDER — AMOXICILLIN-POT CLAVULANATE 875-125 MG PO TABS: 1.0000 | ORAL_TABLET | Freq: Two times a day (BID) | ORAL | 0 refills | Status: AC

## 2020-11-05 NOTE — Patient Instructions (Signed)
   Sinus Congestion 1) Neti Pot (Saline rinse) -- 2 times day -- if tolerated 2) Flonase (Store Brand ok) - once daily 3) Over the counter congestion medications  Cough 1) Cough drops can be helpful 2) Nyquil (or nighttime cough medication) 3) Honey is proven to be one of the best cough medications  4) Cough medicine with Dextromethorphan can also be helpful  Sore Throat 1) Honey as above, cough drops 2) Ibuprofen or Aleve can be helpful 3) Salt water Gargles

## 2020-11-05 NOTE — Progress Notes (Signed)
I connected with Dominique Russo on 11/05/20 at 12:20 PM EDT by video and verified that I am speaking with the correct person using two identifiers.   I discussed the limitations, risks, security and privacy concerns of performing an evaluation and management service by video and the availability of in person appointments. I also discussed with the patient that there may be a patient responsible charge related to this service. The patient expressed understanding and agreed to proceed.  Patient location: parked car Provider Location: [facility] Participants: Lynnda Child and Dominique Russo   Subjective:     Dominique Russo is a 40 y.o. female presenting for Cough (Productive w/ green mucous x 3 weeks. Has not tested for covid due to history with same symptoms.) and Sinus Problem     Cough This is a new problem. The current episode started 1 to 4 weeks ago. The problem has been gradually worsening. The problem occurs every few hours (will have wheezing, cough, choke and resolve). The cough is Productive of purulent sputum. Associated symptoms include nasal congestion, a sore throat and wheezing. Pertinent negatives include no chest pain, chills, fever or myalgias. She has tried OTC cough suppressant (mucinex) for the symptoms.  Sinus Problem Associated symptoms include congestion, coughing, sinus pressure and a sore throat. Pertinent negatives include no chills.    Review of Systems  Constitutional:  Negative for chills and fever.  HENT:  Positive for congestion, sinus pressure, sinus pain, sore throat and voice change.   Respiratory:  Positive for cough and wheezing.   Cardiovascular:  Negative for chest pain.  Gastrointestinal:  Negative for diarrhea, nausea and vomiting.  Musculoskeletal:  Negative for myalgias.    Social History   Tobacco Use  Smoking Status Never  Smokeless Tobacco Never        Objective:   BP Readings from Last 3  Encounters:  06/17/20 90/70  03/20/19 130/78  06/07/17 94/60   Wt Readings from Last 3 Encounters:  11/05/20 140 lb (63.5 kg)  06/17/20 138 lb 8 oz (62.8 kg)  03/20/19 145 lb 6 oz (65.9 kg)   Wt 140 lb (63.5 kg)   LMP 10/28/2020 (Exact Date)   BMI 22.94 kg/m   Physical Exam Constitutional:      Appearance: Normal appearance. She is not ill-appearing.  HENT:     Head: Normocephalic and atraumatic.     Comments: Hoarse voice    Right Ear: External ear normal.     Left Ear: External ear normal.  Eyes:     Conjunctiva/sclera: Conjunctivae normal.  Pulmonary:     Effort: Pulmonary effort is normal. No respiratory distress.  Neurological:     Mental Status: She is alert. Mental status is at baseline.  Psychiatric:        Mood and Affect: Mood normal.        Behavior: Behavior normal.        Thought Content: Thought content normal.        Judgment: Judgment normal.            Assessment & Plan:   Problem List Items Addressed This Visit   None Visit Diagnoses     Acute non-recurrent pansinusitis    -  Primary   Relevant Medications   amoxicillin-clavulanate (AUGMENTIN) 875-125 MG tablet      Advise saline rinse but given worsening symptoms and 3 weeks. Discussed abx for sinus infection.   She will try neti pot for  24-48 hours and if no improvement start abx   Return if symptoms worsen or fail to improve.  Lynnda Child, MD

## 2021-07-17 DIAGNOSIS — R69 Illness, unspecified: Secondary | ICD-10-CM | POA: Diagnosis not present

## 2021-08-11 DIAGNOSIS — R69 Illness, unspecified: Secondary | ICD-10-CM | POA: Diagnosis not present

## 2021-08-20 ENCOUNTER — Encounter: Payer: Self-pay | Admitting: Family Medicine

## 2021-08-20 ENCOUNTER — Ambulatory Visit (INDEPENDENT_AMBULATORY_CARE_PROVIDER_SITE_OTHER): Payer: 59 | Admitting: Family Medicine

## 2021-08-20 DIAGNOSIS — F418 Other specified anxiety disorders: Secondary | ICD-10-CM | POA: Diagnosis not present

## 2021-08-20 DIAGNOSIS — R69 Illness, unspecified: Secondary | ICD-10-CM | POA: Diagnosis not present

## 2021-08-20 MED ORDER — SERTRALINE HCL 50 MG PO TABS: 50.0000 mg | ORAL_TABLET | Freq: Every day | ORAL | 3 refills | Status: DC

## 2021-08-20 NOTE — Progress Notes (Signed)
? ?Subjective:  ? ? Patient ID: Dominique Russo, female    DOB: 05-04-1980, 41 y.o.   MRN: [id number] ? ?HPI ?Pt presents for f/u of depression and anxiety  ? ? ?Wt Readings from Last 3 Encounters:  ?08/20/21 128 lb 4 oz (58.2 kg)  ?11/05/20 140 lb (63.5 kg)  ?06/17/20 138 lb 8 oz (62.8 kg)  ? ?21.02 kg/m? ? ?Had issues in the past with depression and anxiety and took zoloft 100 mg daily  ? ?Did better  ?Then came off of it herself /cold [place] in late summer 2021  ?No trouble at first -pushed through  ? ?When she took it - noted she no longer felt like she was jumping off a cliff  ? ?Small things make her anxious now /even if insignificant  ?High functioning    ?Feels "jacked up"  ?Understands when she feels irrational  ? ?High level of stress right now  ? ?Family may notice it  ? ?She just returned to her therapist  ?Is talking  ?This is helpful  ? ?No suicidal thought  ? ?More anxious than depressed  ?Executive function -/concentrating and forgetting things is harder  ?Has a lot on her  ? ?Self care: she makes a little effort  ?Likes to get outdoors and exercise  ? ?There will be light at the end of the tunnel  ?No quick fixes  ? ? ?BP Readings from Last 3 Encounters:  ?08/20/21 102/60  ?06/17/20 90/70  ?03/20/19 130/78  ? ? ?  08/20/2021  ?  3:58 PM 09/09/2018  ?  9:05 AM 03/24/2017  ?  3:07 PM  ?Depression screen PHQ 2/9  ?Decreased Interest 2 0 3  ?Down, Depressed, Hopeless 2  3  ?PHQ - 2 Score 4 0 6  ?Altered sleeping 2 1 3   ?Tired, decreased energy 3 1 3   ?Change in appetite 2 0 3  ?Feeling bad or failure about yourself   0 3  ?Trouble concentrating 3 0 3  ?Moving slowly or fidgety/restless 1 0 3  ?Suicidal thoughts 0 0 3  ?PHQ-9 Score 15 2 27   ?Difficult doing work/chores Somewhat difficult    ? ? ?Patient Active Problem List  ? Diagnosis Date Noted  ? Globus sensation 03/20/2019  ? Chest discomfort 03/20/2019  ? Tremor 05/03/2017  ? Depression with anxiety 03/24/2017  ? Fatigue 06/10/2016  ? Post  partum depression 06/10/2016  ? Right flank pain 01/30/2015  ? LGA (large for gestational age) fetus 04/24/2014  ? ARTHRALGIA 11/21/2009  ? FIBROCYSTIC BREAST DISEASE 05/18/2007  ? PSORIASIS 05/18/2007  ? ?Past Medical History:  ?Diagnosis Date  ? Fibrocystic breast   ? No pertinent past medical history   ? ?Past Surgical History:  ?Procedure Laterality Date  ? NO PAST SURGERIES    ? ?Social History  ? ?Tobacco Use  ? Smoking status: Never  ? Smokeless tobacco: Never  ?Substance Use Topics  ? Alcohol use: No  ?  Alcohol/week: 0.0 standard drinks  ? Drug use: No  ? ?Family History  ?Problem Relation Age of Onset  ? Hypertension Mother   ? Breast cancer Mother   ? Heart attack Father   ?     x 2, CBG x 2, stent  ? Hypertension Father   ? Anesthesia problems Neg Hx   ? ?Allergies  ?Allergen Reactions  ? Tetanus-Diphtheria Toxoids Td   ?  REACTION: painful local reaction 4/09  ? Xanax [Alprazolam]   ?  Made her  feel more anxious   ? ?No current outpatient medications on file prior to visit.  ? ?No current facility-administered medications on file prior to visit.  ?  ? ?Review of Systems  ?Constitutional:  Positive for appetite change and fatigue. Negative for activity change, fever and unexpected weight change.  ?HENT:  Negative for congestion, ear pain, rhinorrhea, sinus pressure and sore throat.   ?Eyes:  Negative for pain, redness and visual disturbance.  ?Respiratory:  Negative for cough, shortness of breath and wheezing.   ?Cardiovascular:  Negative for chest pain and palpitations.  ?Gastrointestinal:  Negative for abdominal pain, blood in stool, constipation and diarrhea.  ?Endocrine: Negative for polydipsia and polyuria.  ?Genitourinary:  Negative for dysuria, frequency and urgency.  ?Musculoskeletal:  Negative for arthralgias, back pain and myalgias.  ?Skin:  Negative for pallor and rash.  ?Allergic/Immunologic: Negative for environmental allergies.  ?Neurological:  Negative for dizziness, syncope and headaches.   ?Hematological:  Negative for adenopathy. Does not bruise/bleed easily.  ?Psychiatric/Behavioral:  Positive for decreased concentration, dysphoric mood and sleep disturbance. Negative for behavioral problems, confusion, self-injury and suicidal ideas. The patient is nervous/anxious.   ? ?   ?Objective:  ? Physical Exam ?Constitutional:   ?   General: She is not in acute distress. ?   Appearance: Normal appearance. She is well-developed and normal weight. She is not ill-appearing or diaphoretic.  ?HENT:  ?   Head: Normocephalic and atraumatic.  ?   Mouth/Throat:  ?   Mouth: Mucous membranes are moist.  ?Eyes:  ?   General: No scleral icterus. ?   Conjunctiva/sclera: Conjunctivae normal.  ?   Pupils: Pupils are equal, round, and reactive to light.  ?Neck:  ?   Thyroid: No thyromegaly.  ?   Vascular: No carotid bruit or JVD.  ?Cardiovascular:  ?   Rate and Rhythm: Normal rate and regular rhythm.  ?   Heart sounds: Normal heart sounds.  ?  No gallop.  ?Pulmonary:  ?   Effort: Pulmonary effort is normal. No respiratory distress.  ?   Breath sounds: Normal breath sounds. No wheezing or rales.  ?Abdominal:  ?   General: There is no distension or abdominal bruit.  ?   Palpations: Abdomen is soft.  ?Musculoskeletal:  ?   Cervical back: Normal range of motion and neck supple. No tenderness.  ?   Right lower leg: No edema.  ?   Left lower leg: No edema.  ?Lymphadenopathy:  ?   Cervical: No cervical adenopathy.  ?Skin: ?   General: Skin is warm and dry.  ?   Coloration: Skin is not pale.  ?   Findings: No rash.  ?Neurological:  ?   Mental Status: She is alert.  ?   Motor: Tremor present.  ?   Coordination: Coordination normal.  ?   Deep Tendon Reflexes: Reflexes are normal and symmetric. Reflexes normal.  ?Psychiatric:     ?   Attention and Perception: Attention normal.     ?   Mood and Affect: Mood is anxious.     ?   Behavior: Behavior normal.     ?   Thought Content: Thought content normal. Thought content is not paranoid.  Thought content does not include suicidal ideation.     ?   Cognition and Memory: Cognition normal.  ?   Comments: Speech is mildly rapid  ?Eye contact is fair  ? ?Candidly discusses symptoms and stressors  ?  ? ? ? ? ? ?   ?  Assessment & Plan:  ? ?Problem List Items Addressed This Visit   ? ?  ? Other  ? Depression with anxiety  ?  Previously took sertraline 100 mg daily  ?Off since mid 2021 and did well until stress increased again  ?Reviewed stressors/ coping techniques/symptoms/ support sources/ tx options and side effects in detail today ?Not symptoms are primarily anxious (jittery, worried and poor appetite and sleep)  ?Denies self harm or SI  ?PHQ score of 15  ?Is back with counselor now/helpful  ?Disc imp of self care ?Px sertraline 25 mg daily to titrate up to 50 mg daily  ?Discussed expectations of SSRI medication including time to effectiveness and mechanism of action, also poss of side effects (early and late)- including mental fuzziness, weight or appetite change, nausea and poss of worse dep or anxiety (even suicidal thoughts)  Pt voiced understanding and will stop med and update if this occurs   ?F/u 6-8 wk or earlier if needed ? ?  ?  ? Relevant Medications  ? sertraline (ZOLOFT) 50 MG tablet  ? ? ?

## 2021-08-20 NOTE — Patient Instructions (Addendum)
Take 1/2 pill of the 50 mg sertraline daily in the evening  ? ?After 1-2 weeks you can go up to 1 whole pill daily in the evening  ? ?If any intolerable side effects let us know  ?If you feel worse also let us know also  ? ?Continue counseling  ? ?If you feel like you could hurt yourself - let us know and get to the ER  ? ?Try and practice self care  ? ?Follow up in 6-8 weeks (or earlier if needed)  either in person or virtually  ? ? ? ? ? ? ?

## 2021-08-20 NOTE — Assessment & Plan Note (Signed)
Previously took sertraline 100 mg daily  ?Off since mid 2021 and did well until stress increased again  ?Reviewed stressors/ coping techniques/symptoms/ support sources/ tx options and side effects in detail today ?Not symptoms are primarily anxious (jittery, worried and poor appetite and sleep)  ?Denies self harm or SI  ?PHQ score of 15  ?Is back with counselor now/helpful  ?Disc imp of self care ?Px sertraline 25 mg daily to titrate up to 50 mg daily  ?Discussed expectations of SSRI medication including time to effectiveness and mechanism of action, also poss of side effects (early and late)- including mental fuzziness, weight or appetite change, nausea and poss of worse dep or anxiety (even suicidal thoughts)  Pt voiced understanding and will stop med and update if this occurs   ?F/u 6-8 wk or earlier if needed ?

## 2021-08-22 ENCOUNTER — Telehealth: Payer: Self-pay | Admitting: Family Medicine

## 2021-08-22 MED ORDER — SERTRALINE HCL 50 MG PO TABS: 50.0000 mg | ORAL_TABLET | Freq: Every day | ORAL | 3 refills | Status: DC

## 2021-08-22 NOTE — Telephone Encounter (Signed)
Rx resent to new pharmacy.

## 2021-08-22 NOTE — Telephone Encounter (Signed)
?  Encourage patient to contact the pharmacy for refills or they can request refills through [facility] ? ?LAST APPOINTMENT DATE:  Please schedule appointment if longer than 1 year ? ?NEXT APPOINTMENT DATE: ? ?MEDICATION:sertaline ? ?Is the patient out of medication?  ? ?PHARMACY:[facility] ?[address] ?[place], [place] ?[phone] ? ?Let patient know to contact pharmacy at the end of the day to make sure medication is ready. ? ?Please notify patient to allow 48-72 hours to process ? ?CLINICAL FILLS OUT ALL BELOW:  ? ?LAST REFILL: ? ?QTY: ? ?REFILL DATE: ? ? ? ?OTHER COMMENTS: pt stated can you send medication to this pharmacy please ? ? ?Okay for refill? ? ?Please advise ? ? ?  ?

## 2021-09-01 DIAGNOSIS — R69 Illness, unspecified: Secondary | ICD-10-CM | POA: Diagnosis not present

## 2021-09-29 DIAGNOSIS — R69 Illness, unspecified: Secondary | ICD-10-CM | POA: Diagnosis not present

## 2021-10-21 DIAGNOSIS — R69 Illness, unspecified: Secondary | ICD-10-CM | POA: Diagnosis not present

## 2021-12-30 DIAGNOSIS — R69 Illness, unspecified: Secondary | ICD-10-CM | POA: Diagnosis not present

## 2022-02-04 DIAGNOSIS — H43392 Other vitreous opacities, left eye: Secondary | ICD-10-CM | POA: Diagnosis not present

## 2022-03-04 DIAGNOSIS — H43392 Other vitreous opacities, left eye: Secondary | ICD-10-CM | POA: Diagnosis not present

## 2022-03-19 ENCOUNTER — Other Ambulatory Visit: Payer: Self-pay | Admitting: Family Medicine

## 2022-06-29 ENCOUNTER — Encounter: Payer: Self-pay | Admitting: *Deleted

## 2022-06-29 ENCOUNTER — Ambulatory Visit: Payer: 59 | Admitting: Family Medicine

## 2022-06-29 ENCOUNTER — Encounter: Payer: Self-pay | Admitting: Family Medicine

## 2022-06-29 VITALS — BP 92/60 | HR 75 | Temp 97.9°F | Ht 65.5 in | Wt 135.2 lb

## 2022-06-29 DIAGNOSIS — F418 Other specified anxiety disorders: Secondary | ICD-10-CM | POA: Diagnosis not present

## 2022-06-29 DIAGNOSIS — E042 Nontoxic multinodular goiter: Secondary | ICD-10-CM

## 2022-06-29 MED ORDER — SERTRALINE HCL 50 MG PO TABS: 50.0000 mg | ORAL_TABLET | Freq: Every day | ORAL | 3 refills | Status: DC

## 2022-06-29 NOTE — Telephone Encounter (Signed)
Imaging printed and placed in your inbox for review

## 2022-06-29 NOTE — Progress Notes (Signed)
Subjective:    Patient ID: Dominique Russo, female    DOB: 06/09/1980, 42 y.o.   MRN: [id number]  HPI Pt presents for annual f/u of mood /chronic medical problems   Wt Readings from Last 3 Encounters:  06/29/22 135 lb 4 oz (61.3 kg)  08/20/21 128 lb 4 oz (58.2 kg)  11/05/20 140 lb (63.5 kg)   22.16 kg/m  Vitals:   06/29/22 1527  BP: 92/60  Pulse: 75  Temp: 97.9 F (36.6 C)  SpO2: 97%     Had a full body scan in [place] (elective full body MRI)  Found ? Spots on thyroid - small masses, less than 1 cm each  Radiology thought too small to warrant f/u  Was told to follow up about that   Feeling fine overall      Lab Results  Component Value Date   TSH 1.03 05/03/2017      Depression with anxiety   Last visit we re started sertraline 50 mg daily  Doing well with this dose and wants to continue  Was seeing a counselor , may go back   Levels out anxiety  Allows her to be calm to get perspective   Stress level is a lot better  Cleaning up some stressors in her life    Is doing some things for herself  Is riding horses again- doing shows , really enjoys it   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  HENT:  Negative for congestion, ear pain, rhinorrhea, sinus pressure and sore throat.   Eyes:  Negative for pain, redness and visual disturbance.  Respiratory:  Negative for cough, shortness of breath and wheezing.   Cardiovascular:  Negative for chest pain and palpitations.  Gastrointestinal:  Negative for abdominal pain, blood in stool, constipation and diarrhea.  Endocrine: Negative for polydipsia and polyuria.  Genitourinary:  Negative for dysuria, frequency and urgency.  Musculoskeletal:  Negative for arthralgias, back pain and myalgias.  Skin:  Negative for pallor and rash.  Allergic/Immunologic:  Negative for environmental allergies.  Neurological:  Negative for dizziness, syncope and headaches.  Hematological:  Negative for adenopathy. Does not bruise/bleed easily.  Psychiatric/Behavioral:  Negative for decreased concentration and dysphoric mood. The patient is nervous/anxious.        Improved mood Still some anxious moments  Able to handle stressors          Objective:   Physical Exam Constitutional:      General: She is not in acute distress.    Appearance: Normal appearance. She is well-developed and normal weight. She is not ill-appearing or diaphoretic.  HENT:     Head: Normocephalic and atraumatic.     Mouth/Throat:     Mouth: Mucous membranes are moist.  Eyes:     General: No scleral icterus.    Conjunctiva/sclera: Conjunctivae normal.     Pupils: Pupils are equal, round, and reactive to light.  Neck:     Thyroid: No thyromegaly.     Vascular: No carotid bruit or JVD.     Comments: No thyroid enlargment noted No thyroid bruits  Cardiovascular:     Rate and Rhythm: Normal rate and regular rhythm.     Heart sounds: Normal heart sounds.     No gallop.  Pulmonary:     Effort: Pulmonary effort is normal. No respiratory  distress.     Breath sounds: Normal breath sounds. No wheezing or rales.  Abdominal:     General: There is no distension or abdominal bruit.     Palpations: Abdomen is soft.  Musculoskeletal:     Cervical back: Normal range of motion and neck supple. No tenderness.     Right lower leg: No edema.     Left lower leg: No edema.  Lymphadenopathy:     Cervical: No cervical adenopathy.  Skin:    General: Skin is warm and dry.     Coloration: Skin is not pale.     Findings: No rash.  Neurological:     Mental Status: She is alert.     Motor: No tremor.     Coordination: Coordination normal.     Deep Tendon Reflexes: Reflexes are normal and symmetric. Reflexes normal.  Psychiatric:        Attention and Perception: Attention normal.        Mood  and Affect: Mood normal. Mood is not anxious or depressed.        Cognition and Memory: Cognition and memory normal.     Comments: Candidly discusses symptoms and stressors             Assessment & Plan:   Problem List Items Addressed This Visit       Endocrine   Multiple thyroid nodules - Primary    Per pt 2 thyroid nodules seen on MRI - both less than 1 cm  No symptoms   TSH and FT4 ordered Thyroid US ordered   Nl exam       Relevant Orders   TSH   T4, free   US THYROID     Other   Depression with anxiety    Dong much better overall  On sertraline 50 mg daily and tolerates it well   Has opt to see counselor when needed  Doing things she enjoys (horse riding) Good health habits  Feels like she deals with stress better       Relevant Medications   sertraline (ZOLOFT) 50 MG tablet

## 2022-06-29 NOTE — Assessment & Plan Note (Signed)
Dominique Russo much better overall  On sertraline 50 mg daily and tolerates it well   Has opt to see counselor when needed  Doing things she enjoys (horse riding) Good health habits  Feels like she deals with stress better

## 2022-06-29 NOTE — Patient Instructions (Addendum)
Send [place] the mri report when you can   Take care of yourself  Try and eat balanced diet and stay active  Do thinks you enjoy when you can   Labs today for thryoid  Let's organize an ultrasound  I put the referral in  Please let us know if you don't hear in 1-2 weeks

## 2022-06-29 NOTE — Assessment & Plan Note (Signed)
Per pt 2 thyroid nodules seen on MRI - both less than 1 cm  No symptoms   TSH and FT4 ordered Thyroid US ordered   Nl exam

## 2022-06-30 LAB — TSH: TSH: 1.73 u[IU]/mL (ref 0.35–5.50)

## 2022-06-30 LAB — T4, FREE: Free T4: 0.8 ng/dL (ref 0.60–1.60)

## 2022-07-08 ENCOUNTER — Ambulatory Visit
Admission: RE | Admit: 2022-07-08 | Discharge: 2022-07-08 | Disposition: A | Discharge status: Home / Self Care | Payer: 59 | Source: Ambulatory Visit | Attending: Family Medicine | Admitting: Family Medicine

## 2022-07-08 DIAGNOSIS — E041 Nontoxic single thyroid nodule: Secondary | ICD-10-CM | POA: Diagnosis not present

## 2022-07-08 DIAGNOSIS — E042 Nontoxic multinodular goiter: Secondary | ICD-10-CM

## 2023-02-16 DIAGNOSIS — H43392 Other vitreous opacities, left eye: Secondary | ICD-10-CM | POA: Diagnosis not present

## 2023-03-19 ENCOUNTER — Other Ambulatory Visit: Payer: Self-pay | Admitting: Family Medicine

## 2023-05-21 ENCOUNTER — Other Ambulatory Visit: Payer: Self-pay | Admitting: Family Medicine

## 2023-05-21 NOTE — Telephone Encounter (Signed)
 Pt is due for a CPE or at least a f/u on or after 06/30/23, please schedule and route back to me, thanks

## 2023-05-21 NOTE — Telephone Encounter (Signed)
 Please schedule annual in march/April  Thanks

## 2023-05-24 NOTE — Telephone Encounter (Signed)
 Lvmtcb, sent mychart message

## 2023-06-19 DIAGNOSIS — S61315A Laceration without foreign body of left ring finger with damage to nail, initial encounter: Secondary | ICD-10-CM | POA: Diagnosis not present

## 2023-10-24 ENCOUNTER — Other Ambulatory Visit: Payer: Self-pay | Admitting: Family Medicine
